# Patient Record
Sex: Male | Born: 1970
Health system: Southern US, Community
[De-identification: ages and names within clinical notes are randomized; demographics above are authoritative.]

## PROBLEM LIST (undated history)

## (undated) ENCOUNTER — Emergency Department: Payer: 59

## (undated) DIAGNOSIS — E291 Testicular hypofunction: Secondary | ICD-10-CM

## (undated) DIAGNOSIS — J302 Other seasonal allergic rhinitis: Secondary | ICD-10-CM

## (undated) DIAGNOSIS — E663 Overweight: Secondary | ICD-10-CM

## (undated) DIAGNOSIS — K219 Gastro-esophageal reflux disease without esophagitis: Secondary | ICD-10-CM

## (undated) DIAGNOSIS — E786 Lipoprotein deficiency: Secondary | ICD-10-CM

## (undated) DIAGNOSIS — N529 Male erectile dysfunction, unspecified: Secondary | ICD-10-CM

## (undated) DIAGNOSIS — J45909 Unspecified asthma, uncomplicated: Secondary | ICD-10-CM

## (undated) HISTORY — DX: Other seasonal allergic rhinitis: J30.2

## (undated) HISTORY — DX: Testicular hypofunction: E29.1

## (undated) HISTORY — DX: Male erectile dysfunction, unspecified: N52.9

## (undated) HISTORY — DX: Gastro-esophageal reflux disease without esophagitis: K21.9

## (undated) HISTORY — DX: Unspecified asthma, uncomplicated: J45.909

## (undated) HISTORY — DX: Overweight: E66.3

## (undated) HISTORY — DX: Lipoprotein deficiency: E78.6

---

## 2016-08-28 ENCOUNTER — Other Ambulatory Visit: Payer: Self-pay | Admitting: Family Medicine

## 2016-08-28 ENCOUNTER — Ambulatory Visit
Admission: RE | Admit: 2016-08-28 | Discharge: 2016-08-28 | Disposition: A | Discharge status: Home / Self Care | Payer: BLUE CROSS/BLUE SHIELD | Source: Ambulatory Visit | Attending: Family Medicine | Admitting: Family Medicine

## 2016-08-28 DIAGNOSIS — R52 Pain, unspecified: Secondary | ICD-10-CM

## 2016-08-28 DIAGNOSIS — M25579 Pain in unspecified ankle and joints of unspecified foot: Secondary | ICD-10-CM | POA: Diagnosis present

## 2016-08-28 DIAGNOSIS — M25571 Pain in right ankle and joints of right foot: Secondary | ICD-10-CM | POA: Diagnosis not present

## 2016-08-28 DIAGNOSIS — M79673 Pain in unspecified foot: Secondary | ICD-10-CM | POA: Diagnosis present

## 2016-08-28 DIAGNOSIS — M79671 Pain in right foot: Secondary | ICD-10-CM | POA: Insufficient documentation

## 2018-01-12 ENCOUNTER — Ambulatory Visit
Admission: RE | Admit: 2018-01-12 | Discharge: 2018-01-12 | Disposition: A | Discharge status: Home / Self Care | Payer: BLUE CROSS/BLUE SHIELD | Source: Ambulatory Visit | Attending: Emergency Medicine | Admitting: Emergency Medicine

## 2018-01-12 ENCOUNTER — Other Ambulatory Visit: Payer: Self-pay | Admitting: Emergency Medicine

## 2018-01-12 DIAGNOSIS — R05 Cough: Secondary | ICD-10-CM | POA: Diagnosis present

## 2018-01-12 DIAGNOSIS — R059 Cough, unspecified: Secondary | ICD-10-CM

## 2018-01-12 DIAGNOSIS — R0602 Shortness of breath: Secondary | ICD-10-CM | POA: Diagnosis present

## 2018-06-29 ENCOUNTER — Other Ambulatory Visit: Payer: Self-pay | Admitting: Family Medicine

## 2018-08-26 ENCOUNTER — Other Ambulatory Visit: Payer: Self-pay

## 2018-08-27 LAB — CBC WITH DIFFERENTIAL/PLATELET
BASOS ABS: 0 10*3/uL (ref 0.0–0.2)
Basos: 1 %
EOS (ABSOLUTE): 0.2 10*3/uL (ref 0.0–0.4)
Eos: 4 %
Hematocrit: 51.8 % — ABNORMAL HIGH (ref 37.5–51.0)
Hemoglobin: 15.6 g/dL (ref 13.0–17.7)
IMMATURE GRANS (ABS): 0 10*3/uL (ref 0.0–0.1)
IMMATURE GRANULOCYTES: 0 %
LYMPHS: 29 %
Lymphocytes Absolute: 1.7 10*3/uL (ref 0.7–3.1)
MCH: 24.5 pg — ABNORMAL LOW (ref 26.6–33.0)
MCHC: 30.1 g/dL — ABNORMAL LOW (ref 31.5–35.7)
MCV: 81 fL (ref 79–97)
Monocytes Absolute: 0.5 10*3/uL (ref 0.1–0.9)
Monocytes: 8 %
NEUTROS PCT: 58 %
Neutrophils Absolute: 3.5 10*3/uL (ref 1.4–7.0)
PLATELETS: 340 10*3/uL (ref 150–450)
RBC: 6.36 x10E6/uL — ABNORMAL HIGH (ref 4.14–5.80)
RDW: 16.8 % — AB (ref 11.6–15.4)
WBC: 5.9 10*3/uL (ref 3.4–10.8)

## 2018-08-27 LAB — TESTOSTERONE,FREE AND TOTAL
Testosterone, Free: 30.1 pg/mL — ABNORMAL HIGH (ref 6.8–21.5)
Testosterone: 724 ng/dL (ref 264–916)

## 2018-10-04 ENCOUNTER — Other Ambulatory Visit: Payer: Self-pay

## 2018-10-05 LAB — CBC WITH DIFFERENTIAL/PLATELET
Basophils Absolute: 0 10*3/uL (ref 0.0–0.2)
Basos: 1 %
EOS (ABSOLUTE): 0.2 10*3/uL (ref 0.0–0.4)
Eos: 3 %
Hematocrit: 48.8 % (ref 37.5–51.0)
Hemoglobin: 15.8 g/dL (ref 13.0–17.7)
Immature Grans (Abs): 0 10*3/uL (ref 0.0–0.1)
Immature Granulocytes: 0 %
Lymphocytes Absolute: 1.4 10*3/uL (ref 0.7–3.1)
Lymphs: 22 %
MCH: 25.4 pg — ABNORMAL LOW (ref 26.6–33.0)
MCHC: 32.4 g/dL (ref 31.5–35.7)
MCV: 79 fL (ref 79–97)
Monocytes Absolute: 0.5 10*3/uL (ref 0.1–0.9)
Monocytes: 7 %
Neutrophils Absolute: 4.3 10*3/uL (ref 1.4–7.0)
Neutrophils: 67 %
Platelets: 313 10*3/uL (ref 150–450)
RBC: 6.22 x10E6/uL — ABNORMAL HIGH (ref 4.14–5.80)
RDW: 17 % — ABNORMAL HIGH (ref 11.6–15.4)
WBC: 6.4 10*3/uL (ref 3.4–10.8)

## 2018-11-16 ENCOUNTER — Emergency Department
Admission: EM | Admit: 2018-11-16 | Discharge: 2018-11-16 | Discharge status: Against Medical Advice | Payer: No Typology Code available for payment source | Attending: Emergency Medicine | Admitting: Emergency Medicine

## 2018-11-16 ENCOUNTER — Encounter: Payer: Self-pay | Admitting: Emergency Medicine

## 2018-11-16 ENCOUNTER — Other Ambulatory Visit: Payer: Self-pay

## 2018-11-16 ENCOUNTER — Emergency Department: Payer: No Typology Code available for payment source

## 2018-11-16 DIAGNOSIS — R0789 Other chest pain: Secondary | ICD-10-CM | POA: Insufficient documentation

## 2018-11-16 DIAGNOSIS — Z77098 Contact with and (suspected) exposure to other hazardous, chiefly nonmedicinal, chemicals: Secondary | ICD-10-CM

## 2018-11-16 DIAGNOSIS — J9801 Acute bronchospasm: Secondary | ICD-10-CM | POA: Insufficient documentation

## 2018-11-16 DIAGNOSIS — Z5320 Procedure and treatment not carried out because of patient's decision for unspecified reasons: Secondary | ICD-10-CM | POA: Diagnosis not present

## 2018-11-16 DIAGNOSIS — J45909 Unspecified asthma, uncomplicated: Secondary | ICD-10-CM | POA: Diagnosis not present

## 2018-11-16 DIAGNOSIS — R0602 Shortness of breath: Secondary | ICD-10-CM | POA: Diagnosis present

## 2018-11-16 MED ORDER — ALBUTEROL SULFATE (2.5 MG/3ML) 0.083% IN NEBU
2.5000 mg | INHALATION_SOLUTION | Freq: Once | RESPIRATORY_TRACT | Status: AC
  Administered 2018-11-16: 2.5 mg via RESPIRATORY_TRACT

## 2018-11-16 MED ORDER — DEXAMETHASONE SODIUM PHOSPHATE 10 MG/ML IJ SOLN
10.0000 mg | Freq: Once | INTRAMUSCULAR | Status: AC
  Administered 2018-11-16: 10 mg via INTRAMUSCULAR
  Filled 2018-11-16: qty 1

## 2018-11-16 MED ORDER — ALBUTEROL SULFATE (2.5 MG/3ML) 0.083% IN NEBU
INHALATION_SOLUTION | RESPIRATORY_TRACT | Status: AC
  Administered 2018-11-16: 15:00:00 2.5 mg via RESPIRATORY_TRACT
  Filled 2018-11-16: qty 3

## 2018-11-16 MED ORDER — IPRATROPIUM-ALBUTEROL 0.5-2.5 (3) MG/3ML IN SOLN
3.0000 mL | Freq: Once | RESPIRATORY_TRACT | Status: AC
  Administered 2018-11-16: 3 mL via RESPIRATORY_TRACT
  Filled 2018-11-16: qty 3

## 2018-11-16 MED ORDER — IPRATROPIUM-ALBUTEROL 0.5-2.5 (3) MG/3ML IN SOLN
3.0000 mL | Freq: Once | RESPIRATORY_TRACT | Status: AC
  Administered 2018-11-16: 14:00:00 3 mL via RESPIRATORY_TRACT
  Filled 2018-11-16: qty 3

## 2018-11-16 MED ORDER — ALBUTEROL SULFATE HFA 108 (90 BASE) MCG/ACT IN AERS: 2.0000 | INHALATION_SPRAY | Freq: Four times a day (QID) | RESPIRATORY_TRACT | 2 refills | Status: AC | PRN

## 2018-11-16 NOTE — ED Provider Notes (Addendum)
[facility] Emergency Department Provider Note   ____________________________________________    I have reviewed the triage vital signs and the nursing notes.   HISTORY  Chief Complaint Shortness of Breath and Chemical Exposure     HPI Seth Ibarra is a 48 y.o. male who presents with complaints of shortness of breath after chemical exposure.  Patient reports that he was exposed to a small amount of chlorine fumes which made him rapidly feel short of breath.  He does have a history of asthma and feels tightness in his chest.  Denies fevers or chills or myalgias.  No nausea or vomiting.  No recent travel.  No calf pain or swelling.  Not take anything for this  History reviewed. No pertinent past medical history.  There are no active problems to display for this patient.   History reviewed. No pertinent surgical history.  Prior to Admission medications   Not on File     Allergies Patient has no known allergies.  No family history on file.  Social History Social History   Tobacco Use  . Smoking status: Never Smoker  . Smokeless tobacco: Never Used  Substance Use Topics  . Alcohol use: Not Currently  . Drug use: Not on file    Review of Systems  Constitutional: No fever/chills Eyes: No visual changes.  ENT: No sore throat. Cardiovascular: Denies chest pain. Respiratory: As above Gastrointestinal: No abdominal pain.  No nausea, no vomiting.   Genitourinary: Negative for dysuria. Musculoskeletal: Negative for back pain. Skin: Negative for rash. Neurological: Negative for headaches or weakness   ____________________________________________   PHYSICAL EXAM:  VITAL SIGNS: ED Triage Vitals  Enc Vitals Group     BP 11/16/18 1148 131/64     Pulse Rate 11/16/18 1148 (!) 104     Resp 11/16/18 1148 16     Temp 11/16/18 1148 98 F (36.7 C)     Temp Source 11/16/18 1148 Oral     SpO2 11/16/18 1148 93 %     Weight 11/16/18 1148 95.3  kg (210 lb)     Height 11/16/18 1148 1.727 m (5\' 8" )     Head Circumference --      Peak Flow --      Pain Score 11/16/18 1146 0     Pain Loc --      Pain Edu? --      Excl. in GC? --     Constitutional: Alert and oriented.  Eyes: Conjunctivae are normal.   Nose: No congestion/rhinnorhea.  Cardiovascular: Mild tachycardia, regular rhythm. Grossly normal heart sounds.  Good peripheral circulation. Respiratory: Normal respiratory effort.  No retractions.  Scattered wheezes Gastrointestinal: Soft and nontender. No distention.  No CVA tenderness.  Musculoskeletal: No lower extremity tenderness nor edema.  Warm and well perfused Neurologic:  Normal speech and language. No gross focal neurologic deficits are appreciated.  Skin:  Skin is warm, dry and intact. No rash noted. Psychiatric: Mood and affect are normal. Speech and behavior are normal.  ____________________________________________   LABS (all labs ordered are listed, but only abnormal results are displayed)  Labs Reviewed  SARS CORONAVIRUS 2 (HOSPITAL ORDER, PERFORMED IN [facility])  CBC WITH DIFFERENTIAL/PLATELET  COMPREHENSIVE METABOLIC PANEL   ____________________________________________  EKG   ____________________________________________  RADIOLOGY  Chest x-ray ____________________________________________   PROCEDURES  Procedure(s) performed: No  Procedures   Critical Care performed: yes  CRITICAL CARE Performed by: Jene Everyobert Moss Berry   Total critical care time: 30  minutes  Critical care time was exclusive of separately billable procedures and treating other patients.  Critical care was necessary to treat or prevent imminent or life-threatening deterioration.  Critical care was time spent personally by me on the following activities: development of treatment plan with patient and/or surrogate as well as nursing, discussions with consultants, evaluation of patient's response to  treatment, examination of patient, obtaining history from patient or surrogate, ordering and performing treatments and interventions, ordering and review of laboratory studies, ordering and review of radiographic studies, pulse oximetry and re-evaluation of patient's condition.  ____________________________________________   INITIAL IMPRESSION / ASSESSMENT AND PLAN / ED COURSE  Pertinent labs & imaging results that were available during my care of the patient were reviewed by me and considered in my medical decision making (see chart for details).  Patient presents with shortness of breath after exposure to chlorine gas, likely bronchospasm related.  We will give IM Decadron, duo nebs, obtain chest x-ray and monitor carefully.  Clinical Course as of Nov 16 1454  Tue Nov 16, 2018  1401 Requiring oxygen, continued scattered wheezing will give additional DuoNeb and send labs, check COVID in case the patient requires admission   [RK]    Clinical Course User Index [RK] Jene Every, MD     ----------------------------------------- 2:55 PM on 11/16/2018 -----------------------------------------  Patient has refused labs, COVID swab, he feels that he will be able to go home, is feeling somewhat better after her DuoNeb however remains mildly tachycardic with oxygen saturations around 91 92%.  He would like to try 1 more nebulizer   ----------------------------------------- 3:13 PM on 11/16/2018 -----------------------------------------  Patient remains tachycardic oxygen saturations are in the low 90s, I recommended that he stay in the emergency department for further observation and consideration of admission but the patient is refusing to stay any longer.  He is leaving AGAINST MEDICAL ADVICE ____________________________________________   FINAL CLINICAL IMPRESSION(S) / ED DIAGNOSES  Final diagnoses:  Exposure to chemical inhalation  Acute bronchospasm        Note:  This  document was prepared using Dragon voice recognition software and may include unintentional dictation errors.   Jene Every, MD 11/16/18 1513    Jene Every, MD 11/24/18 [place]

## 2018-11-16 NOTE — ED Notes (Signed)
Pt continues to report feeling comfortable on RA.

## 2018-11-16 NOTE — ED Notes (Signed)
Pt placed on 2L oxygen via [place].  

## 2018-11-16 NOTE — ED Triage Notes (Signed)
Pt states he inhaled chlorine on the job today, was not wearing respirator mask at that time, states having a hard time getting a full breath, hx of asthma. Denies burning in nose, mouth, states he didn't inhale much. Workers comp case. Pt in no distress at this time.

## 2018-11-16 NOTE — ED Notes (Signed)
Pt requesting to hold off on IV, blood and COVID testing until after Duo-neb. PT does not want further testing unless he will be admitted.

## 2018-12-01 ENCOUNTER — Other Ambulatory Visit: Payer: Self-pay | Admitting: Internal Medicine

## 2018-12-06 ENCOUNTER — Ambulatory Visit: Payer: Self-pay

## 2018-12-06 ENCOUNTER — Other Ambulatory Visit: Payer: Self-pay

## 2018-12-06 DIAGNOSIS — R7989 Other specified abnormal findings of blood chemistry: Secondary | ICD-10-CM

## 2018-12-06 MED ORDER — TESTOSTERONE CYPIONATE 200 MG/ML IM SOLN
100.0000 mg | INTRAMUSCULAR | Status: DC
  Administered 2018-12-06 – 2018-12-20 (×3): 100 mg via INTRAMUSCULAR

## 2018-12-13 ENCOUNTER — Ambulatory Visit: Payer: Self-pay

## 2018-12-13 ENCOUNTER — Other Ambulatory Visit: Payer: Self-pay

## 2018-12-13 DIAGNOSIS — E291 Testicular hypofunction: Secondary | ICD-10-CM

## 2018-12-20 ENCOUNTER — Other Ambulatory Visit: Payer: Self-pay

## 2018-12-20 ENCOUNTER — Ambulatory Visit: Payer: Self-pay

## 2018-12-20 DIAGNOSIS — E291 Testicular hypofunction: Secondary | ICD-10-CM

## 2018-12-27 ENCOUNTER — Other Ambulatory Visit: Payer: Self-pay

## 2018-12-27 ENCOUNTER — Ambulatory Visit: Payer: Self-pay

## 2018-12-27 DIAGNOSIS — E291 Testicular hypofunction: Secondary | ICD-10-CM

## 2018-12-27 MED ORDER — TESTOSTERONE CYPIONATE 200 MG/ML IM SOLN
100.0000 mg | INTRAMUSCULAR | Status: DC
  Administered 2018-12-27 – 2019-02-07 (×4): 100 mg via INTRAMUSCULAR

## 2019-01-03 ENCOUNTER — Other Ambulatory Visit: Payer: Self-pay

## 2019-01-03 ENCOUNTER — Ambulatory Visit: Payer: Self-pay

## 2019-01-03 DIAGNOSIS — E291 Testicular hypofunction: Secondary | ICD-10-CM

## 2019-01-06 ENCOUNTER — Other Ambulatory Visit: Payer: Self-pay

## 2019-01-06 ENCOUNTER — Other Ambulatory Visit: Payer: Managed Care, Other (non HMO)

## 2019-01-06 DIAGNOSIS — E291 Testicular hypofunction: Secondary | ICD-10-CM

## 2019-01-08 LAB — CBC WITH DIFFERENTIAL/PLATELET
Basophils Absolute: 0 10*3/uL (ref 0.0–0.2)
Basos: 0 %
EOS (ABSOLUTE): 0.2 10*3/uL (ref 0.0–0.4)
Eos: 3 %
Hematocrit: 50.9 % (ref 37.5–51.0)
Hemoglobin: 16.4 g/dL (ref 13.0–17.7)
Immature Grans (Abs): 0 10*3/uL (ref 0.0–0.1)
Immature Granulocytes: 0 %
Lymphocytes Absolute: 1.7 10*3/uL (ref 0.7–3.1)
Lymphs: 27 %
MCH: 26.5 pg — ABNORMAL LOW (ref 26.6–33.0)
MCHC: 32.2 g/dL (ref 31.5–35.7)
MCV: 82 fL (ref 79–97)
Monocytes Absolute: 0.5 10*3/uL (ref 0.1–0.9)
Monocytes: 8 %
Neutrophils Absolute: 3.9 10*3/uL (ref 1.4–7.0)
Neutrophils: 62 %
Platelets: 312 10*3/uL (ref 150–450)
RBC: 6.2 x10E6/uL — ABNORMAL HIGH (ref 4.14–5.80)
RDW: 15.3 % (ref 11.6–15.4)
WBC: 6.3 10*3/uL (ref 3.4–10.8)

## 2019-01-08 LAB — TESTOSTERONE,FREE AND TOTAL
Testosterone, Free: 23.1 pg/mL — ABNORMAL HIGH (ref 6.8–21.5)
Testosterone: 457 ng/dL (ref 264–916)

## 2019-01-10 ENCOUNTER — Telehealth: Payer: Self-pay

## 2019-01-10 NOTE — Telephone Encounter (Signed)
Pt aware and he will donate blood he has in the past to get levels lower. He will come every other week not for injections not weekly. Will order labs when he comes for his appt.

## 2019-01-10 NOTE — Telephone Encounter (Signed)
-----   Message from Towanda Malkin, MD sent at 01/10/2019  3:01 PM EDT ----- Please call and let him know that he needs to decrease his testosterone to every other week, same dose (0.5cc). Not on MyChart His levels are still above the normal range for free testosterone (23.1), and his Hgb and Hct are climbing again. Rec's for testosterone injections are to monitor Hct every 3 months during therapy and if elevation occurs, withhold until acceptable level and permanently discontinue if elevated after restarting treatment.   If he desires to donate blood (hasn't in 2020 per the chart), that would be encouraged (with HCT right at that upper limit of normal range).  Please check CBC again in three months, and also testosterone and free testosterone.

## 2019-01-18 ENCOUNTER — Other Ambulatory Visit: Payer: Self-pay

## 2019-01-18 ENCOUNTER — Ambulatory Visit: Payer: Self-pay

## 2019-01-18 DIAGNOSIS — E291 Testicular hypofunction: Secondary | ICD-10-CM

## 2019-02-07 ENCOUNTER — Ambulatory Visit: Payer: Managed Care, Other (non HMO)

## 2019-02-07 ENCOUNTER — Other Ambulatory Visit: Payer: Self-pay

## 2019-02-07 DIAGNOSIS — E291 Testicular hypofunction: Secondary | ICD-10-CM

## 2019-02-07 MED ORDER — TESTOSTERONE CYPIONATE 200 MG/ML IM SOLN
100.0000 mg | INTRAMUSCULAR | Status: AC
  Administered 2019-03-01 – 2019-05-09 (×6): 100 mg via INTRAMUSCULAR

## 2019-03-01 ENCOUNTER — Other Ambulatory Visit: Payer: Self-pay

## 2019-03-01 ENCOUNTER — Ambulatory Visit: Payer: Self-pay

## 2019-03-01 DIAGNOSIS — E291 Testicular hypofunction: Secondary | ICD-10-CM

## 2019-03-14 ENCOUNTER — Other Ambulatory Visit: Payer: Self-pay | Admitting: Internal Medicine

## 2019-03-14 ENCOUNTER — Ambulatory Visit: Payer: Managed Care, Other (non HMO)

## 2019-03-14 ENCOUNTER — Other Ambulatory Visit: Payer: Self-pay

## 2019-03-14 DIAGNOSIS — E291 Testicular hypofunction: Secondary | ICD-10-CM

## 2019-03-15 ENCOUNTER — Other Ambulatory Visit: Payer: Self-pay | Admitting: Internal Medicine

## 2019-03-28 ENCOUNTER — Other Ambulatory Visit: Payer: Self-pay

## 2019-03-28 ENCOUNTER — Ambulatory Visit: Payer: Managed Care, Other (non HMO)

## 2019-03-28 DIAGNOSIS — E291 Testicular hypofunction: Secondary | ICD-10-CM

## 2019-04-11 ENCOUNTER — Ambulatory Visit: Payer: Managed Care, Other (non HMO)

## 2019-04-11 ENCOUNTER — Other Ambulatory Visit: Payer: Self-pay

## 2019-04-11 DIAGNOSIS — E291 Testicular hypofunction: Secondary | ICD-10-CM

## 2019-04-18 ENCOUNTER — Other Ambulatory Visit: Payer: Self-pay

## 2019-04-18 ENCOUNTER — Other Ambulatory Visit: Payer: Managed Care, Other (non HMO)

## 2019-04-18 DIAGNOSIS — Z0000 Encounter for general adult medical examination without abnormal findings: Secondary | ICD-10-CM

## 2019-04-18 LAB — POCT URINALYSIS DIPSTICK
Bilirubin, UA: NEGATIVE
Blood, UA: NEGATIVE
Glucose, UA: NEGATIVE
Ketones, UA: NEGATIVE
Leukocytes, UA: NEGATIVE
Nitrite, UA: NEGATIVE
Protein, UA: POSITIVE — AB
Spec Grav, UA: 1.02 (ref 1.010–1.025)
Urobilinogen, UA: 1 E.U./dL
pH, UA: 6 (ref 5.0–8.0)

## 2019-04-20 LAB — CMP12+LP+TP+TSH+6AC+PSA+CBC…
ALT: 34 IU/L (ref 0–44)
AST: 31 IU/L (ref 0–40)
Albumin/Globulin Ratio: 1.6 (ref 1.2–2.2)
Albumin: 4.3 g/dL (ref 4.0–5.0)
Alkaline Phosphatase: 70 IU/L (ref 39–117)
BUN/Creatinine Ratio: 11 (ref 9–20)
BUN: 11 mg/dL (ref 6–24)
Basophils Absolute: 0 10*3/uL (ref 0.0–0.2)
Basos: 0 %
Bilirubin Total: 0.5 mg/dL (ref 0.0–1.2)
Calcium: 9 mg/dL (ref 8.7–10.2)
Chloride: 101 mmol/L (ref 96–106)
Chol/HDL Ratio: 5.3 ratio — ABNORMAL HIGH (ref 0.0–5.0)
Cholesterol, Total: 147 mg/dL (ref 100–199)
Creatinine, Ser: 1.02 mg/dL (ref 0.76–1.27)
EOS (ABSOLUTE): 0.1 10*3/uL (ref 0.0–0.4)
Eos: 3 %
Estimated CHD Risk: 1.1 times avg. — ABNORMAL HIGH (ref 0.0–1.0)
Free Thyroxine Index: 1.7 (ref 1.2–4.9)
GFR calc Af Amer: 101 mL/min/{1.73_m2} (ref >59)
GFR calc non Af Amer: 88 mL/min/{1.73_m2} (ref >59)
GGT: 11 IU/L (ref 0–65)
Globulin, Total: 2.7 g/dL (ref 1.5–4.5)
Glucose: 86 mg/dL (ref 65–99)
HDL: 28 mg/dL — ABNORMAL LOW (ref >39)
Hematocrit: 51.6 % — ABNORMAL HIGH (ref 37.5–51.0)
Hemoglobin: 16.8 g/dL (ref 13.0–17.7)
Immature Grans (Abs): 0 10*3/uL (ref 0.0–0.1)
Immature Granulocytes: 1 %
Iron: 53 ug/dL (ref 38–169)
LDH: 202 IU/L (ref 121–224)
LDL Chol Calc (NIH): 94 mg/dL (ref 0–99)
Lymphocytes Absolute: 1.4 10*3/uL (ref 0.7–3.1)
Lymphs: 39 %
MCH: 27.5 pg (ref 26.6–33.0)
MCHC: 32.6 g/dL (ref 31.5–35.7)
MCV: 84 fL (ref 79–97)
Monocytes Absolute: 0.4 10*3/uL (ref 0.1–0.9)
Monocytes: 11 %
Neutrophils Absolute: 1.7 10*3/uL (ref 1.4–7.0)
Neutrophils: 46 %
Phosphorus: 3 mg/dL (ref 2.8–4.1)
Platelets: 251 10*3/uL (ref 150–450)
Potassium: 4.1 mmol/L (ref 3.5–5.2)
Prostate Specific Ag, Serum: 0.4 ng/mL (ref 0.0–4.0)
RBC: 6.12 x10E6/uL — ABNORMAL HIGH (ref 4.14–5.80)
RDW: 16.9 % — ABNORMAL HIGH (ref 11.6–15.4)
Sodium: 142 mmol/L (ref 134–144)
T3 Uptake Ratio: 29 % (ref 24–39)
T4, Total: 5.8 ug/dL (ref 4.5–12.0)
TSH: 1.02 u[IU]/mL (ref 0.450–4.500)
Total Protein: 7 g/dL (ref 6.0–8.5)
Triglycerides: 138 mg/dL (ref 0–149)
Uric Acid: 3.9 mg/dL (ref 3.7–8.6)
VLDL Cholesterol Cal: 25 mg/dL (ref 5–40)
WBC: 3.7 10*3/uL (ref 3.4–10.8)

## 2019-04-20 LAB — TESTOSTERONE,FREE AND TOTAL
Testosterone, Free: 11 pg/mL (ref 6.8–21.5)
Testosterone: 228 ng/dL — ABNORMAL LOW (ref 264–916)

## 2019-04-25 ENCOUNTER — Ambulatory Visit: Payer: Self-pay

## 2019-04-25 ENCOUNTER — Encounter: Payer: Self-pay | Admitting: Physician Assistant

## 2019-04-25 ENCOUNTER — Other Ambulatory Visit: Payer: Self-pay

## 2019-04-25 ENCOUNTER — Ambulatory Visit: Payer: Managed Care, Other (non HMO)

## 2019-04-25 VITALS — BP 132/90 | HR 94 | Temp 98.6°F | Resp 12 | Ht 67.0 in | Wt 204.0 lb

## 2019-04-25 DIAGNOSIS — Z0000 Encounter for general adult medical examination without abnormal findings: Secondary | ICD-10-CM

## 2019-04-25 NOTE — Progress Notes (Signed)
   Subjective:Annual physical exam    Patient ID: Seth Ibarra, male    DOB: December 27, 1970, 48 y.o.   MRN: [id number]  HPI Patient present for annual physical exam. No compliant or concern.   Review of Systems    Negatve Objective:   Physical Exam HEENT unremarkable Neck supple without bruits or adenopathy Lungs CTA Heart RRR Abdomen with normoactive BS SNTTP No cervical or lumbar deformty No Deformity of upper/lower extremities CNII-XII grossly intact       Assessment & Plan:Well exam  Continue previous medication and follow up as needed.

## 2019-05-09 ENCOUNTER — Other Ambulatory Visit: Payer: Self-pay

## 2019-05-09 ENCOUNTER — Ambulatory Visit: Payer: Self-pay

## 2019-05-09 DIAGNOSIS — E291 Testicular hypofunction: Secondary | ICD-10-CM

## 2019-05-17 ENCOUNTER — Other Ambulatory Visit: Payer: Self-pay

## 2019-05-17 DIAGNOSIS — J45909 Unspecified asthma, uncomplicated: Secondary | ICD-10-CM

## 2019-05-17 MED ORDER — ADVAIR DISKUS 100-50 MCG/DOSE IN AEPB: INHALATION_SPRAY | RESPIRATORY_TRACT | 0 refills | Status: DC

## 2019-05-17 NOTE — Telephone Encounter (Signed)
Patient saw PA Tamala Julian on 04/25/2019.  Patient given 30 day supply advair diskus refill electronic Rx to his pharmacy of choice.  Will review paper chart tomorrow when onsite at clinic to validate previous Rx and appt history from [place] providers. Epic reviewed renal and liver function stable Nov 2020 labs.  Last chest xray 12/2017 WNL pulmonary; degenerative changes noted thoracic spine.

## 2019-05-18 MED ORDER — ADVAIR DISKUS 100-50 MCG/DOSE IN AEPB: INHALATION_SPRAY | RESPIRATORY_TRACT | 5 refills | Status: AC

## 2019-05-18 NOTE — Telephone Encounter (Signed)
Tried to call Seth Ibarra & went straight to voice mail.  Left message for him to call me back.  AMD

## 2019-05-18 NOTE — Telephone Encounter (Signed)
Minor called back.  States when he switched to [place], they gave him [facility] & said it was the generic for Advair.  He said now [facility] is giving him Advair & that's the only one he takes.  He does NOT take both inhalers.  AMD

## 2019-05-18 NOTE — Addendum Note (Signed)
Addended by: Gerarda Fraction A on: 05/18/2019 05:25 PM   Modules accepted: Orders

## 2019-05-18 NOTE — Telephone Encounter (Signed)
Please verify patient not taking Wixela inhaler also.  Pick one advair or Wixela and let us know which he wants filled/I will add refills after clarification.  Per paper chart wixela 100/49mcg 2 puffs BID #60 RF5 Mar 2020 by Dr Roxan Hockey.  2018 physical Dr Cheryll Cockayne stated asthma well controlled with advair. PA Truman Hayward saw patient 2019 physical patient only taking proair Oct but in Nov refill wixela 100/50 2 puffs bid given 27 Apr 2018 by me per patient request.

## 2019-05-18 NOTE — Telephone Encounter (Signed)
Noted refills added to advair for patient #60 RF5.

## 2019-05-23 ENCOUNTER — Ambulatory Visit: Payer: Self-pay

## 2019-06-06 ENCOUNTER — Ambulatory Visit: Payer: Self-pay

## 2019-06-20 ENCOUNTER — Ambulatory Visit: Payer: Self-pay

## 2019-06-28 DIAGNOSIS — S39011A Strain of muscle, fascia and tendon of abdomen, initial encounter: Secondary | ICD-10-CM | POA: Diagnosis not present

## 2019-07-04 ENCOUNTER — Ambulatory Visit: Payer: Self-pay

## 2019-07-25 ENCOUNTER — Other Ambulatory Visit: Payer: Self-pay

## 2019-07-25 ENCOUNTER — Ambulatory Visit: Payer: Self-pay | Admitting: Physician Assistant

## 2019-07-25 VITALS — BP 120/90 | HR 93 | Temp 98.6°F | Ht 68.0 in | Wt 206.0 lb

## 2019-07-25 DIAGNOSIS — K219 Gastro-esophageal reflux disease without esophagitis: Secondary | ICD-10-CM

## 2019-07-25 MED ORDER — ESOMEPRAZOLE MAGNESIUM 40 MG PO CPDR: 40.0000 mg | DELAYED_RELEASE_CAPSULE | Freq: Every day | ORAL | 3 refills | Status: AC

## 2019-07-25 NOTE — Progress Notes (Signed)
   Subjective: Gastric reflux    Patient ID: Seth Ibarra, male    DOB: September 10, 1970, 49 y.o.   MRN: [id number]  HPI Patient presents with approximately 1 week of epigastric pain.  Patient states pain increases with eating.  Patient has history of reflux and normally use Prilosec.  Patient states the medicine does not seem to be effective as it has been in the past.  Review of Systems    Hypogonadism Objective:   Physical Exam Patient appears no acute distress.  HEENT is grossly unremarkable.  Neck is supple for adenopathy or bruits.  Lungs are clear to auscultation heart regular rate and rhythm.       Assessment & Plan: Gastric reflux  Patient was given a trial of Nexium for 2 weeks.  Patient advised follow-up in 1 week to check progress of medication.  Will consider labs and endoscopic exams if no improvement or worsening complaints.

## 2019-08-03 ENCOUNTER — Ambulatory Visit: Payer: Self-pay | Admitting: Registered Nurse

## 2019-08-03 ENCOUNTER — Encounter: Payer: Self-pay | Admitting: Registered Nurse

## 2019-08-03 ENCOUNTER — Other Ambulatory Visit: Payer: Self-pay

## 2019-08-03 VITALS — BP 120/80 | HR 96 | Temp 97.5°F | Resp 16 | Ht 67.0 in | Wt 204.0 lb

## 2019-08-03 DIAGNOSIS — Z6831 Body mass index (BMI) 31.0-31.9, adult: Secondary | ICD-10-CM

## 2019-08-03 DIAGNOSIS — K219 Gastro-esophageal reflux disease without esophagitis: Secondary | ICD-10-CM

## 2019-08-03 NOTE — Progress Notes (Signed)
Subjective:    Patient ID: Seth Ibarra, male    DOB: 24-Feb-1971, 49 y.o.   MRN: [id number]  48y/o caucasian male established patient here for re-evaluation epigastric pain was started on nexium by PA Tamala Julian and patient reported helped a great deal along with TUMS prn.  Patient had course of prednisone in Jan 2020 for muscle strain and had been on omeprazole 10mg  po daily at that time.  Reported stools normal daily denied bright red blood or coffee ground stools.  Denied n/v/d/fever/chills.  Denied history of EGD or CSP.  Last physical with [facility] Nov 2020.  Patient had been on testosterone but stopped/Dr Roxan Hockey weaning down his doses and patient saw no reason to continue at those doses not helping him.  Has not given blood in the past year or had phlebotomy either due to covid and stopping testosterone injections.  Patient reported he works out at [employer] and walks when at work averaging 8-10,000 steps on work days per phone.     Review of Systems  Constitutional: Negative for activity change, appetite change, chills, diaphoresis, fatigue, fever and unexpected weight change.  HENT: Negative for trouble swallowing and voice change.   Eyes: Negative for photophobia and visual disturbance.  Respiratory: Negative for cough, shortness of breath, wheezing and stridor.   Cardiovascular: Negative for chest pain, palpitations and leg swelling.  Gastrointestinal: Positive for abdominal pain. Negative for abdominal distention, anal bleeding, blood in stool, constipation, diarrhea, nausea and vomiting.  Endocrine: Negative for cold intolerance and heat intolerance.  Genitourinary: Negative for difficulty urinating.  Musculoskeletal: Negative for back pain, gait problem, joint swelling, neck pain and neck stiffness.  Skin: Negative for rash.  Allergic/Immunologic: Positive for environmental allergies. Negative for food allergies.  Neurological: Negative for dizziness, tremors, seizures,  syncope, facial asymmetry, speech difficulty, weakness, light-headedness, numbness and headaches.  Hematological: Negative for adenopathy. Does not bruise/bleed easily.  Psychiatric/Behavioral: Negative for agitation, confusion and sleep disturbance.       Objective:   Physical Exam Vitals and nursing note reviewed.  Constitutional:      General: He is awake. He is not in acute distress.    Appearance: Normal appearance. He is well-developed and well-groomed. He is obese. He is not ill-appearing, toxic-appearing or diaphoretic.  HENT:     Head: Normocephalic and atraumatic.     Jaw: There is normal jaw occlusion.     Salivary Glands: Right salivary gland is not diffusely enlarged or tender. Left salivary gland is not diffusely enlarged or tender.     Right Ear: Hearing and external ear normal.     Left Ear: Hearing and external ear normal.     Nose: Nose normal.     Mouth/Throat:     Lips: Pink. No lesions.     Mouth: Mucous membranes are moist.     Pharynx: Oropharynx is clear.  Eyes:     General: Lids are normal. Vision grossly intact. Gaze aligned appropriately. No allergic shiner, visual field deficit or scleral icterus.       Right eye: No discharge.        Left eye: No discharge.     Extraocular Movements: Extraocular movements intact.     Conjunctiva/sclera: Conjunctivae normal.     Pupils: Pupils are equal, round, and reactive to light.  Neck:     Vascular: No carotid bruit.     Trachea: Trachea and phonation normal.  Cardiovascular:     Rate and Rhythm: Normal rate and  regular rhythm.     Pulses: Normal pulses.          Radial pulses are 2+ on the right side and 2+ on the left side.     Heart sounds: Normal heart sounds. No murmur. No friction rub. No gallop.   Pulmonary:     Effort: Pulmonary effort is normal.     Breath sounds: Normal breath sounds and air entry. No stridor, decreased air movement or transmitted upper airway sounds. No decreased breath sounds,  wheezing, rhonchi or rales.     Comments: Wearing cloth mask due to covid 19 pandemic; spoke full sentences without difficulty; no cough observed in exam room Abdominal:     General: Abdomen is flat. Bowel sounds are normal. There is no distension.     Palpations: Abdomen is soft. There is no mass.     Tenderness: There is abdominal tenderness in the right upper quadrant, epigastric area and left upper quadrant. There is no right CVA tenderness, left CVA tenderness, guarding or rebound. Negative signs include Murphy's sign and McBurney's sign.     Hernia: No hernia is present. There is no hernia in the umbilical area or ventral area.     Comments: Mild 2/10 pain with palpation RUQ/epigastric/LUQ; dull to percussion except LUQ tympanny and normoactive bowel sounds; standing to sitting supine and reverse quickly without assistance; no ventral hernia/bulge noted with abdominal crunch  Musculoskeletal:        General: No swelling, tenderness, deformity or signs of injury. Normal range of motion.     Right shoulder: Normal.     Left shoulder: Normal.     Right elbow: Normal.     Left elbow: Normal.     Right hand: Normal.     Left hand: Normal.     Cervical back: Normal, normal range of motion and neck supple. No rigidity or tenderness.     Thoracic back: Normal.     Lumbar back: Normal.     Right hip: Normal.     Left hip: Normal.     Right knee: Normal.     Left knee: Normal.     Right lower leg: No edema.     Left lower leg: No edema.  Lymphadenopathy:     Head:     Right side of head: No submental, submandibular, tonsillar, preauricular, posterior auricular or occipital adenopathy.     Left side of head: No submental, submandibular, tonsillar, preauricular, posterior auricular or occipital adenopathy.     Cervical: No cervical adenopathy.     Right cervical: No superficial, deep or posterior cervical adenopathy.    Left cervical: No superficial, deep or posterior cervical adenopathy.   Skin:    General: Skin is warm and dry.     Capillary Refill: Capillary refill takes less than 2 seconds.     Coloration: Skin is not ashen, cyanotic, jaundiced, mottled, pale or sallow.     Findings: No abrasion, abscess, acne, bruising, burn, ecchymosis, erythema, signs of injury, laceration, lesion, petechiae, rash or wound.     Nails: There is no clubbing.  Neurological:     General: No focal deficit present.     Mental Status: He is alert and oriented to person, place, and time. Mental status is at baseline.     Cranial Nerves: Cranial nerves are intact. No cranial nerve deficit, dysarthria or facial asymmetry.     Sensory: Sensation is intact. No sensory deficit.     Motor: Motor function is intact.  No weakness, tremor, atrophy, abnormal muscle tone or seizure activity.     Coordination: Coordination is intact. Coordination normal.     Gait: Gait is intact. Gait normal.     Comments: In/out of chair and on/off exam table without difficulty; bilateral hand grasp equal 5/5; gait sure and steady in hallway  Psychiatric:        Attention and Perception: Attention and perception normal.        Mood and Affect: Mood and affect normal.        Speech: Speech normal.        Behavior: Behavior normal. Behavior is cooperative.        Thought Content: Thought content normal.        Cognition and Memory: Cognition and memory normal.        Judgment: Judgment normal.           Assessment & Plan:  A-GERD, BMI 31  P-Discussed prednisone probably worsened his baseline gastritis in January 2021.  Discontinued omeprazole rx.  Anti-reflux measures such as raising the head of the bed, avoiding tight clothing or belts, avoiding eating late at night and not lying down shortly after mealtime and achieving weight loss were discussed. Avoid ASA, NSAID's, caffeine, peppermints, alcohol and tobacco. OTC H2 blockers and/or antacids are often very helpful for PRN use.  Patient has 3 refills remaining on  nexium 40mg  po daily #30 RF3 Rx from [facility].  Discussed with patient encouraged weight loss as BMI 31; increase activity to 10000 steps every workday plus one day off; continue gym work also.  However, for chronic or daily symptoms, prescription strength H2 blockers or a trial of PPI's should be used.  Patient should alert me if there are persistent symptoms, dysphagia, weight loss or GI bleeding (bright red or black). Follow up with clinic provider or PCM if symptoms persist despite therapy.  Exitcare handout printed and given on GERD to patient and preventing health risks of overweight Patient verbalized agreement and understanding of treatment plan and had no further questions at this time.     Hx polycythemia not donating blood or phlebotomy in the past year encouraged him to donate blood 1 unit before Nov 2021 physical.  Worried about immune system/exposures to other people during pandemic.  Discussed if he wants to restart testosterone will need urology referral/follow up.  Discussed cardiac risks of testosterone supplementation increases with age.  Discussed his lipid panel with low HDL continue activity/exercise 150 minutes per week and dietary fiber. Patient verbalized understanding information/instructions, agreed with plan of care and had no further questions at this time.   P2:  Diet, Food avoidance that aggravate condition, and Fitness

## 2019-08-03 NOTE — Patient Instructions (Addendum)
Donate blood Increase activity to 10,000 steps 5-6 days per week Weight loss  Gastroesophageal Reflux Disease, Adult Gastroesophageal reflux (GER) happens when acid from the stomach flows up into the tube that connects the mouth and the stomach (esophagus). Normally, food travels down the esophagus and stays in the stomach to be digested. However, when a person has GER, food and stomach acid sometimes move back up into the esophagus. If this becomes a more serious problem, the person may be diagnosed with a disease called gastroesophageal reflux disease (GERD). GERD occurs when the reflux:  Happens often.  Causes frequent or severe symptoms.  Causes problems such as damage to the esophagus. When stomach acid comes in contact with the esophagus, the acid may cause soreness (inflammation) in the esophagus. Over time, GERD may create small holes (ulcers) in the lining of the esophagus. What are the causes? This condition is caused by a problem with the muscle between the esophagus and the stomach (lower esophageal sphincter, or LES). Normally, the LES muscle closes after food passes through the esophagus to the stomach. When the LES is weakened or abnormal, it does not close properly, and that allows food and stomach acid to go back up into the esophagus. The LES can be weakened by certain dietary substances, medicines, and medical conditions, including:  Tobacco use.  Pregnancy.  Having a hiatal hernia.  Alcohol use.  Certain foods and beverages, such as coffee, chocolate, onions, and peppermint. What increases the risk? You are more likely to develop this condition if you:  Have an increased body weight.  Have a connective tissue disorder.  Use NSAID medicines. What are the signs or symptoms? Symptoms of this condition include:  Heartburn.  Difficult or painful swallowing.  The feeling of having a lump in the throat.  Abitter taste in the mouth.  Bad breath.  Having a  large amount of saliva.  Having an upset or bloated stomach.  Belching.  Chest pain. Different conditions can cause chest pain. Make sure you see your health care provider if you experience chest pain.  Shortness of breath or wheezing.  Ongoing (chronic) cough or a night-time cough.  Wearing away of tooth enamel.  Weight loss. How is this diagnosed? Your health care provider will take a medical history and perform a physical exam. To determine if you have mild or severe GERD, your health care provider may also monitor how you respond to treatment. You may also have tests, including:  A test to examine your stomach and esophagus with a small camera (endoscopy).  A test thatmeasures the acidity level in your esophagus.  A test thatmeasures how much pressure is on your esophagus.  A barium swallow or modified barium swallow test to show the shape, size, and functioning of your esophagus. How is this treated? The goal of treatment is to help relieve your symptoms and to prevent complications. Treatment for this condition may vary depending on how severe your symptoms are. Your health care provider may recommend:  Changes to your diet.  Medicine.  Surgery. Follow these instructions at home: Eating and drinking   Follow a diet as recommended by your health care provider. This may involve avoiding foods and drinks such as: ? Coffee and tea (with or without caffeine). ? Drinks that containalcohol. ? Energy drinks and sports drinks. ? Carbonated drinks or sodas. ? Chocolate and cocoa. ? Peppermint and mint flavorings. ? Garlic and onions. ? Horseradish. ? Spicy and acidic foods, including peppers, chili powder,  curry powder, vinegar, hot sauces, and barbecue sauce. ? Citrus fruit juices and citrus fruits, such as oranges, lemons, and limes. ? Tomato-based foods, such as red sauce, chili, salsa, and pizza with red sauce. ? Fried and fatty foods, such as donuts, french fries,  potato chips, and high-fat dressings. ? High-fat meats, such as hot dogs and fatty cuts of red and white meats, such as rib eye steak, sausage, ham, and bacon. ? High-fat dairy items, such as whole milk, butter, and cream cheese.  Eat small, frequent meals instead of large meals.  Avoid drinking large amounts of liquid with your meals.  Avoid eating meals during the 2-3 hours before bedtime.  Avoid lying down right after you eat.  Do not exercise right after you eat. Lifestyle   Do not use any products that contain nicotine or tobacco, such as cigarettes, e-cigarettes, and chewing tobacco. If you need help quitting, ask your health care provider.  Try to reduce your stress by using methods such as yoga or meditation. If you need help reducing stress, ask your health care provider.  If you are overweight, reduce your weight to an amount that is healthy for you. Ask your health care provider for guidance about a safe weight loss goal. General instructions  Pay attention to any changes in your symptoms.  Take over-the-counter and prescription medicines only as told by your health care provider. Do not take aspirin, ibuprofen, or other NSAIDs unless your health care provider told you to do so.  Wear loose-fitting clothing. Do not wear anything tight around your waist that causes pressure on your abdomen.  Raise (elevate) the head of your bed about 6 inches (15 cm).  Avoid bending over if this makes your symptoms worse.  Keep all follow-up visits as told by your health care provider. This is important. Contact a health care provider if:  You have: ? New symptoms. ? Unexplained weight loss. ? Difficulty swallowing or it hurts to swallow. ? Wheezing or a persistent cough. ? A hoarse voice.  Your symptoms do not improve with treatment. Get help right away if you:  Have pain in your arms, neck, jaw, teeth, or back.  Feel sweaty, dizzy, or light-headed.  Have chest pain or  shortness of breath.  Vomit and your vomit looks like blood or coffee grounds.  Faint.  Have stool that is bloody or black.  Cannot swallow, drink, or eat. Summary  Gastroesophageal reflux happens when acid from the stomach flows up into the esophagus. GERD is a disease in which the reflux happens often, causes frequent or severe symptoms, or causes problems such as damage to the esophagus.  Treatment for this condition may vary depending on how severe your symptoms are. Your health care provider may recommend diet and lifestyle changes, medicine, or surgery.  Contact a health care provider if you have new or worsening symptoms.  Take over-the-counter and prescription medicines only as told by your health care provider. Do not take aspirin, ibuprofen, or other NSAIDs unless your health care provider told you to do so.  Keep all follow-up visits as told by your health care provider. This is important. This information is not intended to replace advice given to you by your health care provider. Make sure you discuss any questions you have with your health care provider. Document Revised: 12/09/2017 Document Reviewed: 12/09/2017 Elsevier Patient Education  2020 Elsevier [employer]. Preventing Health Risks of Being Overweight Maintaining a healthy body weight is an important part of  your overall health. Your healthy body weight depends on your age, gender, and height. Being overweight puts you at risk for many health problems, including:  Heart disease.  Diabetes.  Problems sleeping.  Joint problems. You can make changes to your diet and lifestyle to prevent these risks. Consider working with a health care provider or a dietitian to make these changes. What nutrition changes can be made?   Eat only as much as your body needs. In most cases, this is about 2,000 calories a day, but the amount varies depending on your height, gender, and activity level. Ask your health care provider how many  calories you should have each day. Eating more than your body needs on a regular basis can cause you to become overweight or obese.  Eat slowly, and stop eating when you feel full.  Choose healthy foods, including: ? Fruits and vegetables. ? Lean meats. ? Low-fat dairy products. ? High-fiber foods, such as whole grains and beans. ? Healthy snacks like vegetable sticks, a piece of fruit, or a small amount of yogurt or cheese.  Avoid foods and drinks that are high in sugar, salt (sodium), saturated fat, or trans fat. This includes: ? Many desserts such as candy, cookies, and ice cream. ? Soda. ? Fried foods. ? Processed meats such as hot dogs or lunch meats. ? Prepackaged snack foods. What lifestyle changes can be made?   Exercise for at least 150 minutes a week to prevent weight gain, or as often as recommended by your health care provider. Do moderate-intensity exercise, such as brisk walking. ? Spread it out by exercising for 30 minutes 5 days a week, or in short 10-minute bursts several times a day.  Find other ways to stay active and burn calories, such as yard work or a hobby that involves physical activity.  Get at least 8 hours of sleep each night. When you are well-rested, you are more likely to be active and make healthy choices during the day. To sleep better: ? Try to go to bed and wake up at about the same time every day. ? Keep your bedroom dark, quiet, and cool. ? Make sure that your bed is comfortable. ? Avoid stimulating activities, such as watching television or exercising, for at least one hour before bedtime. Why are these changes important? Eating healthy and being active helps you lose weight and prevent health problems caused by being overweight. Making these changes can also help you manage stress, feel better mentally, and connect with friends and family. What can happen if changes are not made? Being overweight can affect you for your entire life. You may  develop joint or bone problems that make it painful or difficult for you to play sports or do activities you enjoy. Being overweight puts stress on your heart and lungs and can lead to medical problems like diabetes, heart disease, and sleeping problems. Where to find support You can get support for preventing health risks of being overweight from:  Your health care provider or a dietitian. They can provide guidance about healthy eating and healthy lifestyle choices.  Weight loss support groups, online or in-person. Where to find more information  MyPlate: https://ball-collins.biz/ ? This an online tool that provides personalized recommendations about foods to eat each day.  The Centers for Disease Control and Prevention: AffordableScrapbook.gl ? This resource gives tips for managing weight and having an active lifestyle. Summary  To prevent unhealthy weight gain, it is important to maintain a healthy diet  high in vegetables and whole grains, exercise regularly, and get at least 8 hours of sleep each night.  Making these changes helps prevent many long-term (chronic) health conditions that can shorten your life, such as diabetes, heart disease, and stroke. This information is not intended to replace advice given to you by your health care provider. Make sure you discuss any questions you have with your health care provider. Document Revised: 02/23/2019 Document Reviewed: 04/29/2017 Elsevier Patient Education  [place].

## 2019-08-03 NOTE — Progress Notes (Signed)
Follow up appt from 07/25/19 with [name], PA-C (Interim Provider).  Seth Ibarra advised Seth Ibarra to stop Prilosec because he had been taking for many years & prescribed Seth Ibarra Nexium 40 mg.  States the pains he was experiencing last week are almost resolved. Thinks he may have some GI symptoms - upset stomach. Overall he says he thinks it's working & he feels better.  AMD

## 2019-09-12 DIAGNOSIS — J4540 Moderate persistent asthma, uncomplicated: Secondary | ICD-10-CM | POA: Insufficient documentation

## 2019-09-12 DIAGNOSIS — R7989 Other specified abnormal findings of blood chemistry: Secondary | ICD-10-CM | POA: Insufficient documentation

## 2019-12-07 ENCOUNTER — Other Ambulatory Visit: Payer: Self-pay | Admitting: Physical Medicine and Rehabilitation

## 2019-12-07 DIAGNOSIS — M5030 Other cervical disc degeneration, unspecified cervical region: Secondary | ICD-10-CM

## 2019-12-07 DIAGNOSIS — M5412 Radiculopathy, cervical region: Secondary | ICD-10-CM

## 2019-12-27 ENCOUNTER — Other Ambulatory Visit: Payer: Self-pay

## 2019-12-27 ENCOUNTER — Other Ambulatory Visit: Payer: 59

## 2019-12-27 ENCOUNTER — Ambulatory Visit
Admission: RE | Admit: 2019-12-27 | Discharge: 2019-12-27 | Disposition: A | Discharge status: Home / Self Care | Payer: 59 | Source: Ambulatory Visit | Attending: Physical Medicine and Rehabilitation | Admitting: Physical Medicine and Rehabilitation

## 2019-12-27 DIAGNOSIS — M5412 Radiculopathy, cervical region: Secondary | ICD-10-CM

## 2019-12-27 DIAGNOSIS — M5030 Other cervical disc degeneration, unspecified cervical region: Secondary | ICD-10-CM

## 2020-01-10 ENCOUNTER — Other Ambulatory Visit: Payer: 59

## 2020-01-17 DIAGNOSIS — M5030 Other cervical disc degeneration, unspecified cervical region: Secondary | ICD-10-CM | POA: Diagnosis not present

## 2020-01-20 DIAGNOSIS — M5030 Other cervical disc degeneration, unspecified cervical region: Secondary | ICD-10-CM | POA: Diagnosis not present

## 2020-01-23 DIAGNOSIS — M5030 Other cervical disc degeneration, unspecified cervical region: Secondary | ICD-10-CM | POA: Diagnosis not present

## 2020-02-06 DIAGNOSIS — M5030 Other cervical disc degeneration, unspecified cervical region: Secondary | ICD-10-CM | POA: Diagnosis not present

## 2020-02-10 DIAGNOSIS — M5030 Other cervical disc degeneration, unspecified cervical region: Secondary | ICD-10-CM | POA: Diagnosis not present

## 2020-02-13 DIAGNOSIS — M5030 Other cervical disc degeneration, unspecified cervical region: Secondary | ICD-10-CM | POA: Diagnosis not present

## 2020-02-16 DIAGNOSIS — M5030 Other cervical disc degeneration, unspecified cervical region: Secondary | ICD-10-CM | POA: Diagnosis not present

## 2020-02-21 DIAGNOSIS — M5030 Other cervical disc degeneration, unspecified cervical region: Secondary | ICD-10-CM | POA: Diagnosis not present

## 2020-02-23 DIAGNOSIS — M5030 Other cervical disc degeneration, unspecified cervical region: Secondary | ICD-10-CM | POA: Diagnosis not present

## 2020-02-27 DIAGNOSIS — M5030 Other cervical disc degeneration, unspecified cervical region: Secondary | ICD-10-CM | POA: Diagnosis not present

## 2020-03-01 DIAGNOSIS — M5030 Other cervical disc degeneration, unspecified cervical region: Secondary | ICD-10-CM | POA: Diagnosis not present

## 2020-03-06 DIAGNOSIS — M5030 Other cervical disc degeneration, unspecified cervical region: Secondary | ICD-10-CM | POA: Diagnosis not present

## 2020-03-08 DIAGNOSIS — M5030 Other cervical disc degeneration, unspecified cervical region: Secondary | ICD-10-CM | POA: Diagnosis not present

## 2020-03-13 DIAGNOSIS — M5030 Other cervical disc degeneration, unspecified cervical region: Secondary | ICD-10-CM | POA: Diagnosis not present

## 2020-03-16 DIAGNOSIS — M5030 Other cervical disc degeneration, unspecified cervical region: Secondary | ICD-10-CM | POA: Diagnosis not present

## 2020-03-19 DIAGNOSIS — M5030 Other cervical disc degeneration, unspecified cervical region: Secondary | ICD-10-CM | POA: Diagnosis not present

## 2020-03-22 DIAGNOSIS — M5030 Other cervical disc degeneration, unspecified cervical region: Secondary | ICD-10-CM | POA: Diagnosis not present

## 2020-03-26 DIAGNOSIS — M5030 Other cervical disc degeneration, unspecified cervical region: Secondary | ICD-10-CM | POA: Diagnosis not present

## 2020-03-28 DIAGNOSIS — M5412 Radiculopathy, cervical region: Secondary | ICD-10-CM | POA: Diagnosis not present

## 2020-03-28 DIAGNOSIS — M5030 Other cervical disc degeneration, unspecified cervical region: Secondary | ICD-10-CM | POA: Diagnosis not present

## 2020-04-03 DIAGNOSIS — M5030 Other cervical disc degeneration, unspecified cervical region: Secondary | ICD-10-CM | POA: Diagnosis not present

## 2020-04-03 DIAGNOSIS — M5412 Radiculopathy, cervical region: Secondary | ICD-10-CM | POA: Diagnosis not present

## 2020-04-23 DIAGNOSIS — M5412 Radiculopathy, cervical region: Secondary | ICD-10-CM | POA: Diagnosis not present

## 2020-04-23 DIAGNOSIS — M5030 Other cervical disc degeneration, unspecified cervical region: Secondary | ICD-10-CM | POA: Diagnosis not present

## 2020-05-07 DIAGNOSIS — E538 Deficiency of other specified B group vitamins: Secondary | ICD-10-CM | POA: Diagnosis not present

## 2020-05-07 DIAGNOSIS — R7989 Other specified abnormal findings of blood chemistry: Secondary | ICD-10-CM | POA: Diagnosis not present

## 2020-05-07 DIAGNOSIS — Z0000 Encounter for general adult medical examination without abnormal findings: Secondary | ICD-10-CM | POA: Diagnosis not present

## 2020-05-14 DIAGNOSIS — R7989 Other specified abnormal findings of blood chemistry: Secondary | ICD-10-CM | POA: Diagnosis not present

## 2020-05-14 DIAGNOSIS — Z0000 Encounter for general adult medical examination without abnormal findings: Secondary | ICD-10-CM | POA: Diagnosis not present

## 2020-05-28 DIAGNOSIS — R7989 Other specified abnormal findings of blood chemistry: Secondary | ICD-10-CM | POA: Diagnosis not present

## 2020-05-28 DIAGNOSIS — Z125 Encounter for screening for malignant neoplasm of prostate: Secondary | ICD-10-CM | POA: Diagnosis not present

## 2020-05-28 DIAGNOSIS — M542 Cervicalgia: Secondary | ICD-10-CM | POA: Diagnosis not present

## 2020-06-18 DIAGNOSIS — M5412 Radiculopathy, cervical region: Secondary | ICD-10-CM | POA: Diagnosis not present

## 2020-06-18 DIAGNOSIS — M4802 Spinal stenosis, cervical region: Secondary | ICD-10-CM | POA: Diagnosis not present

## 2020-06-18 DIAGNOSIS — M5030 Other cervical disc degeneration, unspecified cervical region: Secondary | ICD-10-CM | POA: Diagnosis not present

## 2020-07-05 DIAGNOSIS — M5412 Radiculopathy, cervical region: Secondary | ICD-10-CM | POA: Diagnosis not present

## 2020-07-05 DIAGNOSIS — M5030 Other cervical disc degeneration, unspecified cervical region: Secondary | ICD-10-CM | POA: Diagnosis not present

## 2020-07-26 DIAGNOSIS — M5030 Other cervical disc degeneration, unspecified cervical region: Secondary | ICD-10-CM | POA: Diagnosis not present

## 2020-07-26 DIAGNOSIS — M5412 Radiculopathy, cervical region: Secondary | ICD-10-CM | POA: Diagnosis not present

## 2020-07-26 DIAGNOSIS — M4802 Spinal stenosis, cervical region: Secondary | ICD-10-CM | POA: Diagnosis not present

## 2020-08-28 DIAGNOSIS — M5030 Other cervical disc degeneration, unspecified cervical region: Secondary | ICD-10-CM | POA: Diagnosis not present

## 2020-08-28 DIAGNOSIS — G5602 Carpal tunnel syndrome, left upper limb: Secondary | ICD-10-CM | POA: Diagnosis not present

## 2020-08-29 DIAGNOSIS — R0789 Other chest pain: Secondary | ICD-10-CM | POA: Diagnosis not present

## 2020-08-29 DIAGNOSIS — R7989 Other specified abnormal findings of blood chemistry: Secondary | ICD-10-CM | POA: Diagnosis not present

## 2020-09-17 DIAGNOSIS — R7989 Other specified abnormal findings of blood chemistry: Secondary | ICD-10-CM | POA: Diagnosis not present

## 2020-09-17 DIAGNOSIS — R0789 Other chest pain: Secondary | ICD-10-CM | POA: Diagnosis not present

## 2020-09-19 ENCOUNTER — Ambulatory Visit: Payer: 59

## 2020-09-19 ENCOUNTER — Other Ambulatory Visit: Payer: Self-pay | Admitting: Internal Medicine

## 2020-09-19 DIAGNOSIS — R0600 Dyspnea, unspecified: Secondary | ICD-10-CM | POA: Diagnosis not present

## 2020-09-19 DIAGNOSIS — R0781 Pleurodynia: Secondary | ICD-10-CM

## 2020-09-19 DIAGNOSIS — R0789 Other chest pain: Secondary | ICD-10-CM

## 2020-09-19 DIAGNOSIS — R0609 Other forms of dyspnea: Secondary | ICD-10-CM

## 2020-09-20 ENCOUNTER — Other Ambulatory Visit: Payer: Self-pay

## 2020-09-20 ENCOUNTER — Ambulatory Visit
Admission: RE | Admit: 2020-09-20 | Discharge: 2020-09-20 | Disposition: A | Discharge status: Home / Self Care | Payer: 59 | Source: Ambulatory Visit | Attending: Internal Medicine | Admitting: Internal Medicine

## 2020-09-20 DIAGNOSIS — R0789 Other chest pain: Secondary | ICD-10-CM | POA: Diagnosis not present

## 2020-09-20 DIAGNOSIS — R0781 Pleurodynia: Secondary | ICD-10-CM | POA: Diagnosis not present

## 2020-09-20 DIAGNOSIS — R0600 Dyspnea, unspecified: Secondary | ICD-10-CM | POA: Diagnosis not present

## 2020-09-20 DIAGNOSIS — R0609 Other forms of dyspnea: Secondary | ICD-10-CM

## 2020-09-20 DIAGNOSIS — K449 Diaphragmatic hernia without obstruction or gangrene: Secondary | ICD-10-CM | POA: Diagnosis not present

## 2020-09-20 DIAGNOSIS — R079 Chest pain, unspecified: Secondary | ICD-10-CM | POA: Diagnosis not present

## 2020-09-20 MED ORDER — IOHEXOL 350 MG/ML SOLN
75.0000 mL | Freq: Once | INTRAVENOUS | Status: AC | PRN
  Administered 2020-09-20: 75 mL via INTRAVENOUS

## 2020-11-19 DIAGNOSIS — Z1211 Encounter for screening for malignant neoplasm of colon: Secondary | ICD-10-CM | POA: Diagnosis not present

## 2020-11-19 DIAGNOSIS — K219 Gastro-esophageal reflux disease without esophagitis: Secondary | ICD-10-CM | POA: Diagnosis not present

## 2020-11-19 DIAGNOSIS — R1314 Dysphagia, pharyngoesophageal phase: Secondary | ICD-10-CM | POA: Diagnosis not present

## 2020-11-19 DIAGNOSIS — R0789 Other chest pain: Secondary | ICD-10-CM | POA: Diagnosis not present

## 2020-11-26 ENCOUNTER — Encounter: Payer: Self-pay | Admitting: *Deleted

## 2020-11-27 ENCOUNTER — Encounter: Payer: Self-pay | Admitting: *Deleted

## 2020-11-27 ENCOUNTER — Other Ambulatory Visit: Payer: Self-pay

## 2020-11-27 ENCOUNTER — Encounter
Admission: RE | Disposition: A | Discharge status: Home / Self Care | Payer: Self-pay | Source: Home / Self Care | Attending: Gastroenterology

## 2020-11-27 ENCOUNTER — Ambulatory Visit: Payer: 59 | Admitting: Certified Registered"

## 2020-11-27 ENCOUNTER — Ambulatory Visit
Admission: RE | Admit: 2020-11-27 | Discharge: 2020-11-27 | Disposition: A | Discharge status: Home / Self Care | Payer: 59 | Attending: Gastroenterology | Admitting: Gastroenterology

## 2020-11-27 DIAGNOSIS — K317 Polyp of stomach and duodenum: Secondary | ICD-10-CM | POA: Insufficient documentation

## 2020-11-27 DIAGNOSIS — Z7951 Long term (current) use of inhaled steroids: Secondary | ICD-10-CM | POA: Diagnosis not present

## 2020-11-27 DIAGNOSIS — K2980 Duodenitis without bleeding: Secondary | ICD-10-CM | POA: Diagnosis not present

## 2020-11-27 DIAGNOSIS — R0789 Other chest pain: Secondary | ICD-10-CM | POA: Insufficient documentation

## 2020-11-27 DIAGNOSIS — K449 Diaphragmatic hernia without obstruction or gangrene: Secondary | ICD-10-CM | POA: Insufficient documentation

## 2020-11-27 DIAGNOSIS — K219 Gastro-esophageal reflux disease without esophagitis: Secondary | ICD-10-CM | POA: Insufficient documentation

## 2020-11-27 DIAGNOSIS — Z79890 Hormone replacement therapy (postmenopausal): Secondary | ICD-10-CM | POA: Insufficient documentation

## 2020-11-27 HISTORY — PX: ESOPHAGOGASTRODUODENOSCOPY: SHX5428

## 2020-11-27 SURGERY — EGD (ESOPHAGOGASTRODUODENOSCOPY)
Anesthesia: General

## 2020-11-27 MED ORDER — PROPOFOL 10 MG/ML IV BOLUS
INTRAVENOUS | Status: DC | PRN
  Administered 2020-11-27: 40 mg via INTRAVENOUS
  Administered 2020-11-27: 10 mg via INTRAVENOUS
  Administered 2020-11-27: 40 mg via INTRAVENOUS

## 2020-11-27 MED ORDER — PROPOFOL 500 MG/50ML IV EMUL
INTRAVENOUS | Status: DC | PRN
  Administered 2020-11-27: 155 ug/kg/min via INTRAVENOUS

## 2020-11-27 MED ORDER — SODIUM CHLORIDE 0.9 % IV SOLN: INTRAVENOUS | Status: DC

## 2020-11-27 MED ORDER — MIDAZOLAM HCL 2 MG/2ML IJ SOLN
INTRAMUSCULAR | Status: DC | PRN
  Administered 2020-11-27: 2 mg via INTRAVENOUS

## 2020-11-27 MED ORDER — LIDOCAINE HCL (CARDIAC) PF 100 MG/5ML IV SOSY
PREFILLED_SYRINGE | INTRAVENOUS | Status: DC | PRN
  Administered 2020-11-27: 80 mg via INTRAVENOUS
  Administered 2020-11-27: 20 mg via INTRAVENOUS

## 2020-11-27 MED ORDER — MIDAZOLAM HCL 2 MG/2ML IJ SOLN
INTRAMUSCULAR | Status: AC
  Filled 2020-11-27: qty 2

## 2020-11-27 NOTE — Op Note (Signed)
[facility] Gastroenterology Patient Name: Seth Ibarra Procedure Date: 11/27/2020 12:15 PM MRN: [id number] Account #: [id number] Date of Birth: [birth year] Admit Type: Outpatient Age: 49 Room: [facility] ENDO ROOM 1 Gender: Male Note Status: Finalized Procedure:             Upper GI endoscopy Indications:           Gastro-esophageal reflux disease, Chest pain (non                         cardiac) Providers:             Andrey Farmer MD, MD Referring MD:          Rusty Aus, MD (Referring MD) Medicines:             Monitored Anesthesia Care Complications:         No immediate complications. Estimated blood loss:                         Minimal. Procedure:             Pre-Anesthesia Assessment:                        - Prior to the procedure, a History and Physical was                         performed, and patient medications and allergies were                         reviewed. The patient is competent. The risks and                         benefits of the procedure and the sedation options and                         risks were discussed with the patient. All questions                         were answered and informed consent was obtained.                         Patient identification and proposed procedure were                         verified by the physician, the nurse, the anesthetist                         and the technician in the endoscopy suite. Mental                         Status Examination: alert and oriented. Airway                         Examination: normal oropharyngeal airway and neck                         mobility. Respiratory Examination: clear to  auscultation. CV Examination: normal. Prophylactic                         Antibiotics: The patient does not require prophylactic                         antibiotics. Prior Anticoagulants: The patient has                         taken no previous anticoagulant or  antiplatelet                         agents. ASA Grade Assessment: I - A normal, healthy                         patient. After reviewing the risks and benefits, the                         patient was deemed in satisfactory condition to                         undergo the procedure. The anesthesia plan was to use                         monitored anesthesia care (MAC). Immediately prior to                         administration of medications, the patient was                         re-assessed for adequacy to receive sedatives. The                         heart rate, respiratory rate, oxygen saturations,                         blood pressure, adequacy of pulmonary ventilation, and                         response to care were monitored throughout the                         procedure. The physical status of the patient was                         re-assessed after the procedure.                        After obtaining informed consent, the endoscope was                         passed under direct vision. Throughout the procedure,                         the patient's blood pressure, pulse, and oxygen                         saturations were monitored continuously. The Endoscope  was introduced through the mouth, and advanced to the                         second part of duodenum. The upper GI endoscopy was                         accomplished without difficulty. The patient tolerated                         the procedure well. Findings:      A 3 cm hiatal hernia was present.      The Z-line was irregular.      Normal mucosa was found in the entire esophagus. Biopsies were obtained       from the proximal and distal esophagus with cold forceps for histology       of suspected eosinophilic esophagitis. Estimated blood loss was minimal.      Multiple 3 to 10 mm semi-sessile polyps with no bleeding and no stigmata       of recent bleeding were found in the stomach.  Biopsies were taken with a       cold forceps for histology. Estimated blood loss was minimal.      Localized nodular mucosa was found in the duodenal bulb. Biopsies were       taken with a cold forceps for histology. Estimated blood loss was       minimal. Impression:            - 3 cm hiatal hernia.                        - Z-line irregular.                        - Normal mucosa was found in the entire esophagus.                         Biopsied.                        - Multiple gastric polyps. Biopsied.                        - Nodular mucosa in the duodenal bulb. Biopsied. Recommendation:        - Discharge patient to home.                        - Resume previous diet.                        - Continue present medications.                        - Await pathology results.                        - Return to referring physician as previously                         scheduled. Procedure Code(s):     --- Professional ---  50569, Esophagogastroduodenoscopy, flexible,                         transoral; with biopsy, single or multiple Diagnosis Code(s):     --- Professional ---                        K44.9, Diaphragmatic hernia without obstruction or                         gangrene                        K22.8, Other specified diseases of esophagus                        K31.7, Polyp of stomach and duodenum                        K31.89, Other diseases of stomach and duodenum                        K21.9, Gastro-esophageal reflux disease without                         esophagitis                        R07.89, Other chest pain CPT copyright 2019 American Medical Association. All rights reserved. The codes documented in this report are preliminary and upon coder review may  be revised to meet current compliance requirements. Andrey Farmer MD, MD 11/27/2020 12:33:12 PM Number of Addenda: 0 Note Initiated On: 11/27/2020 12:15 PM Estimated Blood Loss:   Estimated blood loss was minimal.      [facility]

## 2020-11-27 NOTE — Interval H&P Note (Signed)
History and Physical Interval Note:  11/27/2020 12:17 PM  Seth Ibarra  has presented today for surgery, with the diagnosis of GERD.  The various methods of treatment have been discussed with the patient and family. After consideration of risks, benefits and other options for treatment, the patient has consented to  Procedure(s): ESOPHAGOGASTRODUODENOSCOPY (EGD) (N/A) as a surgical intervention.  The patient's history has been reviewed, patient examined, no change in status, stable for surgery.  I have reviewed the patient's chart and labs.  Questions were answered to the patient's satisfaction.     Regis Bill  Ok to proceed with EGD

## 2020-11-27 NOTE — H&P (Signed)
Outpatient short stay form Pre-procedure 11/27/2020 12:12 PM Merlyn Lot MD, MPH  Primary Physician: Dr. Hyacinth Meeker  Reason for visit:  Atypical Chest pain  History of present illness:    50 y/o gentleman with atypical chest pain here for EGD. No blood thinners. Pain is left sided and is constant. No family history of GI malignancies.    Current Facility-Administered Medications:    0.9 %  sodium chloride infusion, , Intravenous, Continuous, Duane Earnshaw, Rossie Muskrat, MD, Last Rate: 20 mL/hr at 11/27/20 1206, New Bag at 11/27/20 1206  Medications Prior to Admission  Medication Sig Dispense Refill Last Dose   ADVAIR DISKUS 100-50 MCG/DOSE AEPB Inhae 1 puff bid as directed Rinse mouth after use 60 each 5 11/27/2020   albuterol (VENTOLIN HFA) 108 (90 Base) MCG/ACT inhaler Inhale 2 puffs into the lungs every 6 (six) hours as needed for wheezing or shortness of breath. 1 Inhaler 2 Past Week   diclofenac (VOLTAREN) 75 MG EC tablet Take 75 mg by mouth 2 (two) times daily.      testosterone cypionate (DEPOTESTOSTERONE CYPIONATE) 200 MG/ML injection Inject into the muscle every 14 (fourteen) days.   Past Week   dexlansoprazole (DEXILANT) 60 MG capsule Take 60 mg by mouth daily. (Patient not taking: Reported on 11/27/2020)   Not Taking   esomeprazole (NEXIUM) 40 MG capsule Take 1 capsule (40 mg total) by mouth daily. (Patient not taking: Reported on 11/27/2020) 30 capsule 3 Not Taking   sucralfate (CARAFATE) 1 g tablet Take 1 g by mouth 4 (four) times daily -  with meals and at bedtime. (Patient not taking: Reported on 11/27/2020)   Not Taking     No Known Allergies   Past Medical History:  Diagnosis Date   Asthma    ED (erectile dysfunction)    Mild   GERD (gastroesophageal reflux disease)    Hypogonadism in male    Low HDL (under 40)    Overweight    Seasonal rhinitis     Review of systems:  Otherwise negative.    Physical Exam  Gen: Alert, oriented. Appears stated age.  HEENT:  PERRLA. Lungs: No respiratory distress CV: RRR Abd: soft, benign, no masses Ext: No edema    Planned procedures: Proceed with EGD. The patient understands the nature of the planned procedure, indications, risks, alternatives and potential complications including but not limited to bleeding, infection, perforation, damage to internal organs and possible oversedation/side effects from anesthesia. The patient agrees and gives consent to proceed.  Please refer to procedure notes for findings, recommendations and patient disposition/instructions.     Merlyn Lot MD, MPH Gastroenterology 11/27/2020  12:12 PM

## 2020-11-27 NOTE — Anesthesia Postprocedure Evaluation (Signed)
Anesthesia Post Note  Patient: Seth Ibarra  Procedure(s) Performed: ESOPHAGOGASTRODUODENOSCOPY (EGD)  Patient location during evaluation: Endoscopy Anesthesia Type: General Level of consciousness: awake and alert Pain management: pain level controlled Vital Signs Assessment: post-procedure vital signs reviewed and stable Respiratory status: spontaneous breathing, nonlabored ventilation, respiratory function stable and patient connected to nasal cannula oxygen Cardiovascular status: blood pressure returned to baseline and stable Postop Assessment: no apparent nausea or vomiting Anesthetic complications: no   No notable events documented.   Last Vitals:  Vitals:   11/27/20 1152 11/27/20 1231  BP: (!) 156/107 129/85  Pulse: 98   Resp: 17   Temp: 36.4 C (!) 36.1 C  SpO2: 98%     Last Pain:  Vitals:   11/27/20 1231  TempSrc: Temporal  PainSc: Asleep                 Corinda Gubler

## 2020-11-27 NOTE — Anesthesia Preprocedure Evaluation (Signed)
Anesthesia Evaluation  Patient identified by MRN, date of birth, ID band Patient awake    Reviewed: Allergy & Precautions, NPO status , Patient's Chart, lab work & pertinent test results  History of Anesthesia Complications Negative for: history of anesthetic complications  Airway Mallampati: III  TM Distance: >3 FB Neck ROM: Full    Dental no notable dental hx. (+) Teeth Intact   Pulmonary asthma , neg sleep apnea, neg COPD, Patient abstained from smoking.Not current smoker,  Hospitalized once for asthma at age 50. Good control since then   Pulmonary exam normal breath sounds clear to auscultation       Cardiovascular Exercise Tolerance: Good METS(-) hypertension(-) CAD and (-) Past MI negative cardio ROS  (-) dysrhythmias  Rhythm:Regular Rate:Normal - Systolic murmurs    Neuro/Psych negative neurological ROS  negative psych ROS   GI/Hepatic GERD  ,(+)     (-) substance abuse  ,   Endo/Other  neg diabetes  Renal/GU negative Renal ROS     Musculoskeletal   Abdominal   Peds  Hematology   Anesthesia Other Findings Past Medical History: No date: Asthma No date: ED (erectile dysfunction)     Comment:  Mild No date: GERD (gastroesophageal reflux disease) No date: Hypogonadism in male No date: Low HDL (under 40) No date: Overweight No date: Seasonal rhinitis  Reproductive/Obstetrics                             Anesthesia Physical Anesthesia Plan  ASA: 2  Anesthesia Plan: General   Post-op Pain Management:    Induction: Intravenous  PONV Risk Score and Plan: 2 and Ondansetron, Propofol infusion and TIVA  Airway Management Planned: Nasal Cannula  Additional Equipment: None  Intra-op Plan:   Post-operative Plan:   Informed Consent: I have reviewed the patients History and Physical, chart, labs and discussed the procedure including the risks, benefits and alternatives for  the proposed anesthesia with the patient or authorized representative who has indicated his/her understanding and acceptance.     Dental advisory given  Plan Discussed with: CRNA and Surgeon  Anesthesia Plan Comments: (Discussed risks of anesthesia with patient, including possibility of difficulty with spontaneous ventilation under anesthesia necessitating airway intervention, PONV, and rare risks such as cardiac or respiratory or neurological events. Patient understands.)        Anesthesia Quick Evaluation

## 2020-11-27 NOTE — Transfer of Care (Signed)
Immediate Anesthesia Transfer of Care Note  Patient: Seth Ibarra  Procedure(s) Performed: ESOPHAGOGASTRODUODENOSCOPY (EGD)  Patient Location: Endoscopy Unit  Anesthesia Type:General  Level of Consciousness: awake, drowsy and patient cooperative  Airway & Oxygen Therapy: Patient Spontanous Breathing and Patient connected to face mask oxygen  Post-op Assessment: Report given to RN and Post -op Vital signs reviewed and stable  Post vital signs: Reviewed and stable  Last Vitals:  Vitals Value Taken Time  BP 129/85 11/27/20 1232  Temp    Pulse 99 11/27/20 1233  Resp 20 11/27/20 1233  SpO2 99 % 11/27/20 1233  Vitals shown include unvalidated device data.  Last Pain:  Vitals:   11/27/20 1231  TempSrc: Temporal  PainSc: Asleep         Complications: No notable events documented.

## 2020-11-28 ENCOUNTER — Encounter: Payer: Self-pay | Admitting: Gastroenterology

## 2020-11-28 LAB — SURGICAL PATHOLOGY

## 2020-12-20 ENCOUNTER — Encounter: Payer: Self-pay | Admitting: Physical Medicine and Rehabilitation

## 2020-12-21 DIAGNOSIS — R059 Cough, unspecified: Secondary | ICD-10-CM | POA: Diagnosis not present

## 2020-12-21 DIAGNOSIS — J069 Acute upper respiratory infection, unspecified: Secondary | ICD-10-CM | POA: Diagnosis not present

## 2021-01-07 DIAGNOSIS — K219 Gastro-esophageal reflux disease without esophagitis: Secondary | ICD-10-CM | POA: Diagnosis not present

## 2021-01-07 DIAGNOSIS — K589 Irritable bowel syndrome without diarrhea: Secondary | ICD-10-CM | POA: Diagnosis not present

## 2021-01-07 DIAGNOSIS — R0789 Other chest pain: Secondary | ICD-10-CM | POA: Diagnosis not present

## 2021-01-07 DIAGNOSIS — K2980 Duodenitis without bleeding: Secondary | ICD-10-CM | POA: Diagnosis not present

## 2021-01-23 ENCOUNTER — Other Ambulatory Visit: Payer: Self-pay

## 2021-01-23 ENCOUNTER — Encounter: Payer: 59 | Attending: Physical Medicine and Rehabilitation | Admitting: Physical Medicine and Rehabilitation

## 2021-01-23 ENCOUNTER — Encounter: Payer: Self-pay | Admitting: Physical Medicine and Rehabilitation

## 2021-01-23 VITALS — BP 125/87 | HR 102 | Temp 98.9°F | Ht 68.0 in | Wt 207.0 lb

## 2021-01-23 DIAGNOSIS — G522 Disorders of vagus nerve: Secondary | ICD-10-CM | POA: Insufficient documentation

## 2021-01-23 DIAGNOSIS — R0782 Intercostal pain: Secondary | ICD-10-CM | POA: Diagnosis not present

## 2021-01-23 MED ORDER — LIDOCAINE 5 % EX PTCH: 1.0000 | MEDICATED_PATCH | CUTANEOUS | 0 refills | Status: DC

## 2021-01-23 NOTE — Progress Notes (Signed)
Subjective:    Patient ID: Seth Ibarra, male    DOB: 12/18/1970, 50 y.o.   MRN: [id number]  HPI Seth Ibarra is a 50 year old man who presents to establish care for chest pain.   -He notes no inciting event -He dies have a history of neck pain for which he had EMG/NCS that was negative except for carpal tunnel syndrom -he had a CT chest that was normal -he had an endoscopy that was normal.  -pain feels constant -has not worked out since September since he did not want to make things worse -he works for the city  -he has never tried lidocaine patches -this pain seemed to start with  -pain feels dull, constant    Pain Inventory Average Pain 4 Pain Right Now 3 My pain is constant and dull  In the last 24 hours, has pain interfered with the following? General activity 3 Relation with others 3 Enjoyment of life 3 What TIME of day is your pain at its worst? morning , daytime, evening, and night Sleep (in general) Fair  Pain is worse with:  none Pain improves with:  none Relief from Meds: 0  walk without assistance ability to climb steps?  yes do you drive?  yes  employed # of hrs/week 40 what is your job? [place]   No problems in this area  new  new    No family history on file. Social History   Socioeconomic History   Marital status: Single    Spouse name: Not on file   Number of children: Not on file   Years of education: Not on file   Highest education level: Not on file  Occupational History   Not on file  Tobacco Use   Smoking status: Never   Smokeless tobacco: Never  Vaping Use   Vaping Use: Never used  Substance and Sexual Activity   Alcohol use: Not Currently   Drug use: Never   Sexual activity: Not on file  Other Topics Concern   Not on file  Social History Narrative   Not on file   Social Determinants of Health   Financial Resource Strain: Not on file  Food Insecurity: Not on file  Transportation Needs: Not on file   Physical Activity: Not on file  Stress: Not on file  Social Connections: Not on file   Past Surgical History:  Procedure Laterality Date   ESOPHAGOGASTRODUODENOSCOPY N/A 11/27/2020   Procedure: ESOPHAGOGASTRODUODENOSCOPY (EGD);  Surgeon: Regis Bill, MD;  Location: [facility] ENDOSCOPY;  Service: Endoscopy;  Laterality: N/A;   Past Medical History:  Diagnosis Date   Asthma    ED (erectile dysfunction)    Mild   GERD (gastroesophageal reflux disease)    Hypogonadism in male    Low HDL (under 40)    Overweight    Seasonal rhinitis    BP 125/87   Pulse (!) 102   Temp 98.9 F (37.2 C)   Ht 5\' 8"  (1.727 m)   Wt 207 lb (93.9 kg)   SpO2 96%   BMI 31.47 kg/m   Opioid Risk Score:   Fall Risk Score:  `1  Depression screen PHQ 2/9  Depression screen PHQ 2/9 01/23/2021  Decreased Interest 1  Down, Depressed, Hopeless 0  PHQ - 2 Score 1  Altered sleeping 0  Tired, decreased energy 1  Change in appetite 1  Feeling bad or failure about yourself  0  Trouble concentrating 0  Moving slowly or fidgety/restless  0  Suicidal thoughts 0  PHQ-9 Score 3  Difficult doing work/chores Not difficult at all    Review of Systems  Constitutional: Negative.   HENT: Negative.    Eyes: Negative.   Cardiovascular: Negative.   Gastrointestinal: Negative.   Endocrine: Negative.   Genitourinary: Negative.   Musculoskeletal: Negative.   Skin: Negative.   Allergic/Immunologic: Negative.   Hematological: Negative.   Psychiatric/Behavioral: Negative.    All other systems reviewed and are negative.     Objective:   Physical Exam Gen: no distress, normal appearing HEENT: oral mucosa pink and moist, NCAT Cardio: Reg rate Chest: normal effort, normal rate of breathing Abd: soft, non-distended Ext: no edema Psych: pleasant, normal affect Skin: intact Neuro: Alert and oriented Musculoskeletal: Nontender to palpation    Assessment & Plan:  1) Chronic Pain Syndrome secondary to  musculoskeletal chest pain.  -Discussed current symptoms of pain and history of pain.  -Discussed benefits of exercise in reducing pain. -Lidocaine 5% patch prescribed.  -Discussed following foods that may reduce pain: 1) Ginger (especially studied for arthritis)- reduce leukotriene production to decrease inflammation 2) Blueberries- high in phytonutrients that decrease inflammation 3) Salmon- marine omega-3s reduce joint swelling and pain 4) Pumpkin seeds- reduce inflammation 5) dark chocolate- reduces inflammation 6) turmeric- reduces inflammation 7) tart cherries - reduce pain and stiffness 8) extra virgin olive oil - its compound olecanthal helps to block prostaglandins  9) chili peppers- can be eaten or applied topically via capsaicin 10) mint- helpful for headache, muscle aches, joint pain, and itching 11) garlic- reduces inflammation  Link to further information on diet for chronic pain: [email or web address]

## 2021-01-23 NOTE — Patient Instructions (Signed)
-  Discussed following foods that may reduce pain: 1) Ginger (especially studied for arthritis)- reduce leukotriene production to decrease inflammation 2) Blueberries- high in phytonutrients that decrease inflammation 3) Salmon- marine omega-3s reduce joint swelling and pain 4) Pumpkin seeds- reduce inflammation 5) dark chocolate- reduces inflammation 6) turmeric- reduces inflammation 7) tart cherries - reduce pain and stiffness 8) extra virgin olive oil - its compound olecanthal helps to block prostaglandins  9) chili peppers- can be eaten or applied topically via capsaicin 10) mint- helpful for headache, muscle aches, joint pain, and itching 11) garlic- reduces inflammation  Link to further information on diet for chronic pain: [email or web address]  

## 2021-02-08 ENCOUNTER — Other Ambulatory Visit: Payer: Self-pay

## 2021-02-08 ENCOUNTER — Telehealth: Payer: Self-pay

## 2021-02-08 ENCOUNTER — Encounter (HOSPITAL_BASED_OUTPATIENT_CLINIC_OR_DEPARTMENT_OTHER): Payer: 59 | Admitting: Physical Medicine and Rehabilitation

## 2021-02-08 DIAGNOSIS — G522 Disorders of vagus nerve: Secondary | ICD-10-CM | POA: Diagnosis not present

## 2021-02-08 DIAGNOSIS — R0782 Intercostal pain: Secondary | ICD-10-CM

## 2021-02-08 MED ORDER — SILDENAFIL CITRATE 25 MG PO TABS: 25.0000 mg | ORAL_TABLET | Freq: Every day | ORAL | 3 refills | Status: DC | PRN

## 2021-02-08 NOTE — Progress Notes (Signed)
Subjective:    Patient ID: Seth Ibarra, male    DOB: [birth year], 50 y.o.   MRN: [id number]  HPI An audio/video tele-health visit is felt to be the most appropriate encounter for this patient at this time.  This is a follow up tele-visit via phonw The patient is at home. MD is at office.    Seth Ibarra is a 50 year old man who presents for follow-up of chest pain.   -He notes no inciting event -He dies have a history of neck pain for which he had EMG/NCS that was negative except for carpal tunnel syndrom -he had a CT chest that was normal -he had an endoscopy that was normal.  -pain feels constant -has not worked out since September since he did not want to make things worse -he works for the city  -he has never tried lidocaine patches -this pain seemed to start with  -pain feels dull, constant -lidocaine patches did not help -has been seeing a chiropractor and was told that his symptoms of abdominal bloating, difficulty swallowing, and pain could be associated with impaired vagal nerve function -he asks what next steps are.  -he has never tried sildenafil before    Pain Inventory Average Pain 4 Pain Right Now 3 My pain is constant and dull  In the last 24 hours, has pain interfered with the following? General activity 3 Relation with others 3 Enjoyment of life 3 What TIME of day is your pain at its worst? morning , daytime, evening, and night Sleep (in general) Fair  Pain is worse with:  none Pain improves with:  none Relief from Meds: 0  walk without assistance ability to climb steps?  yes do you drive?  yes  employed # of hrs/week 40 what is your job? [place]  No problems in this area  new  new    No family history on file. Social History   Socioeconomic History   Marital status: Single    Spouse name: Not on file   Number of children: Not on file   Years of education: Not on file   Highest education level: Not on file  Occupational  History   Not on file  Tobacco Use   Smoking status: Never   Smokeless tobacco: Never  Vaping Use   Vaping Use: Never used  Substance and Sexual Activity   Alcohol use: Not Currently   Drug use: Never   Sexual activity: Not on file  Other Topics Concern   Not on file  Social History Narrative   Not on file   Social Determinants of Health   Financial Resource Strain: Not on file  Food Insecurity: Not on file  Transportation Needs: Not on file  Physical Activity: Not on file  Stress: Not on file  Social Connections: Not on file   Past Surgical History:  Procedure Laterality Date   ESOPHAGOGASTRODUODENOSCOPY N/A 11/27/2020   Procedure: ESOPHAGOGASTRODUODENOSCOPY (EGD);  Surgeon: Regis Bill, MD;  Location: [facility] ENDOSCOPY;  Service: Endoscopy;  Laterality: N/A;   Past Medical History:  Diagnosis Date   Asthma    ED (erectile dysfunction)    Mild   GERD (gastroesophageal reflux disease)    Hypogonadism in male    Low HDL (under 40)    Overweight    Seasonal rhinitis    There were no vitals taken for this visit.  Opioid Risk Score:   Fall Risk Score:  `1  Depression screen [facility] 2/9  Depression screen PHQ 2/9 01/23/2021  Decreased Interest 1  Down, Depressed, Hopeless 0  PHQ - 2 Score 1  Altered sleeping 0  Tired, decreased energy 1  Change in appetite 1  Feeling bad or failure about yourself  0  Trouble concentrating 0  Moving slowly or fidgety/restless 0  Suicidal thoughts 0  PHQ-9 Score 3  Difficult doing work/chores Not difficult at all    Review of Systems  Constitutional: Negative.   HENT: Negative.    Eyes: Negative.   Cardiovascular: Negative.   Gastrointestinal: Negative.   Endocrine: Negative.   Genitourinary: Negative.   Musculoskeletal: Negative.   Skin: Negative.   Allergic/Immunologic: Negative.   Hematological: Negative.   Psychiatric/Behavioral: Negative.    All other systems reviewed and are negative.     Objective:    Physical Exam Not performed as patient was seen via phone.     Assessment & Plan:  1) Chronic Pain Syndrome secondary to chest pain, may be secondary to vagal nerve irritation -Discussed current symptoms of pain and history of pain.  -Discussed benefits of exercise in reducing pain. -Lidocaine 5% patch prescribed- did not help -prescribed sildenafil and discussed foods that can naturally increase nitric oxide production.  -Discussed following foods that may reduce pain: 1) Ginger (especially studied for arthritis)- reduce leukotriene production to decrease inflammation 2) Blueberries- high in phytonutrients that decrease inflammation 3) Salmon- marine omega-3s reduce joint swelling and pain 4) Pumpkin seeds- reduce inflammation 5) dark chocolate- reduces inflammation 6) turmeric- reduces inflammation 7) tart cherries - reduce pain and stiffness 8) extra virgin olive oil - its compound olecanthal helps to block prostaglandins  9) chili peppers- can be eaten or applied topically via capsaicin 10) mint- helpful for headache, muscle aches, joint pain, and itching 11) garlic- reduces inflammation  Link to further information on diet for chronic pain: [email or web address]   5 minutes spent in discussing patient's pain, prescribing sildenafil- discussing to take a night, discussing side effects of headache, discussing behavioral modifications to help reduce vagal nerve irritation

## 2021-02-08 NOTE — Telephone Encounter (Signed)
Sildenafil PA in progress

## 2021-02-14 NOTE — Telephone Encounter (Signed)
Rx only approved for # 6 tablets. Patient picked up RX.

## 2021-04-09 ENCOUNTER — Ambulatory Visit: Payer: 59 | Admitting: Physical Medicine and Rehabilitation

## 2021-04-10 ENCOUNTER — Ambulatory Visit: Payer: 59 | Admitting: Physical Medicine and Rehabilitation

## 2021-04-16 DIAGNOSIS — E569 Vitamin deficiency, unspecified: Secondary | ICD-10-CM | POA: Diagnosis not present

## 2021-04-16 DIAGNOSIS — M792 Neuralgia and neuritis, unspecified: Secondary | ICD-10-CM | POA: Diagnosis not present

## 2021-04-16 DIAGNOSIS — R200 Anesthesia of skin: Secondary | ICD-10-CM | POA: Diagnosis not present

## 2021-04-16 DIAGNOSIS — Z114 Encounter for screening for human immunodeficiency virus [HIV]: Secondary | ICD-10-CM | POA: Diagnosis not present

## 2021-04-22 DIAGNOSIS — E569 Vitamin deficiency, unspecified: Secondary | ICD-10-CM | POA: Diagnosis not present

## 2021-04-22 DIAGNOSIS — R200 Anesthesia of skin: Secondary | ICD-10-CM | POA: Diagnosis not present

## 2021-05-08 DIAGNOSIS — R7989 Other specified abnormal findings of blood chemistry: Secondary | ICD-10-CM | POA: Diagnosis not present

## 2021-05-08 DIAGNOSIS — Z125 Encounter for screening for malignant neoplasm of prostate: Secondary | ICD-10-CM | POA: Diagnosis not present

## 2021-05-08 DIAGNOSIS — Z0000 Encounter for general adult medical examination without abnormal findings: Secondary | ICD-10-CM | POA: Diagnosis not present

## 2021-05-15 DIAGNOSIS — M792 Neuralgia and neuritis, unspecified: Secondary | ICD-10-CM | POA: Diagnosis not present

## 2021-05-15 DIAGNOSIS — R7989 Other specified abnormal findings of blood chemistry: Secondary | ICD-10-CM | POA: Diagnosis not present

## 2021-05-15 DIAGNOSIS — Z0000 Encounter for general adult medical examination without abnormal findings: Secondary | ICD-10-CM | POA: Diagnosis not present

## 2021-06-03 DIAGNOSIS — R768 Other specified abnormal immunological findings in serum: Secondary | ICD-10-CM | POA: Insufficient documentation

## 2021-06-03 DIAGNOSIS — M792 Neuralgia and neuritis, unspecified: Secondary | ICD-10-CM | POA: Diagnosis not present

## 2021-06-03 DIAGNOSIS — M5030 Other cervical disc degeneration, unspecified cervical region: Secondary | ICD-10-CM | POA: Insufficient documentation

## 2021-06-26 DIAGNOSIS — M542 Cervicalgia: Secondary | ICD-10-CM | POA: Diagnosis not present

## 2021-06-26 DIAGNOSIS — K5900 Constipation, unspecified: Secondary | ICD-10-CM | POA: Diagnosis not present

## 2021-10-01 DIAGNOSIS — M792 Neuralgia and neuritis, unspecified: Secondary | ICD-10-CM | POA: Diagnosis not present

## 2021-10-01 DIAGNOSIS — H02402 Unspecified ptosis of left eyelid: Secondary | ICD-10-CM | POA: Diagnosis not present

## 2021-10-01 DIAGNOSIS — R200 Anesthesia of skin: Secondary | ICD-10-CM | POA: Diagnosis not present

## 2021-10-04 ENCOUNTER — Other Ambulatory Visit: Payer: Self-pay | Admitting: Student

## 2021-10-04 DIAGNOSIS — H02402 Unspecified ptosis of left eyelid: Secondary | ICD-10-CM

## 2021-10-04 DIAGNOSIS — M792 Neuralgia and neuritis, unspecified: Secondary | ICD-10-CM

## 2021-10-14 ENCOUNTER — Ambulatory Visit
Admission: RE | Admit: 2021-10-14 | Discharge: 2021-10-14 | Disposition: A | Discharge status: Home / Self Care | Payer: 59 | Source: Ambulatory Visit | Attending: Student | Admitting: Student

## 2021-10-14 DIAGNOSIS — M792 Neuralgia and neuritis, unspecified: Secondary | ICD-10-CM

## 2021-10-14 DIAGNOSIS — R519 Headache, unspecified: Secondary | ICD-10-CM | POA: Diagnosis not present

## 2021-10-14 DIAGNOSIS — H02402 Unspecified ptosis of left eyelid: Secondary | ICD-10-CM

## 2021-10-14 DIAGNOSIS — J320 Chronic maxillary sinusitis: Secondary | ICD-10-CM | POA: Diagnosis not present

## 2021-10-24 DIAGNOSIS — R768 Other specified abnormal immunological findings in serum: Secondary | ICD-10-CM | POA: Diagnosis not present

## 2021-10-24 DIAGNOSIS — M5481 Occipital neuralgia: Secondary | ICD-10-CM | POA: Diagnosis not present

## 2021-10-24 DIAGNOSIS — H02402 Unspecified ptosis of left eyelid: Secondary | ICD-10-CM | POA: Diagnosis not present

## 2021-10-24 DIAGNOSIS — M792 Neuralgia and neuritis, unspecified: Secondary | ICD-10-CM | POA: Diagnosis not present

## 2021-10-24 DIAGNOSIS — E569 Vitamin deficiency, unspecified: Secondary | ICD-10-CM | POA: Diagnosis not present

## 2021-10-28 DIAGNOSIS — M542 Cervicalgia: Secondary | ICD-10-CM | POA: Diagnosis not present

## 2021-10-28 DIAGNOSIS — M62830 Muscle spasm of back: Secondary | ICD-10-CM | POA: Diagnosis not present

## 2021-10-28 DIAGNOSIS — M9902 Segmental and somatic dysfunction of thoracic region: Secondary | ICD-10-CM | POA: Diagnosis not present

## 2021-10-28 DIAGNOSIS — M9901 Segmental and somatic dysfunction of cervical region: Secondary | ICD-10-CM | POA: Diagnosis not present

## 2021-10-29 DIAGNOSIS — M9901 Segmental and somatic dysfunction of cervical region: Secondary | ICD-10-CM | POA: Diagnosis not present

## 2021-10-29 DIAGNOSIS — M542 Cervicalgia: Secondary | ICD-10-CM | POA: Diagnosis not present

## 2021-10-29 DIAGNOSIS — M9902 Segmental and somatic dysfunction of thoracic region: Secondary | ICD-10-CM | POA: Diagnosis not present

## 2021-10-29 DIAGNOSIS — M62830 Muscle spasm of back: Secondary | ICD-10-CM | POA: Diagnosis not present

## 2021-10-30 DIAGNOSIS — M62830 Muscle spasm of back: Secondary | ICD-10-CM | POA: Diagnosis not present

## 2021-10-30 DIAGNOSIS — M542 Cervicalgia: Secondary | ICD-10-CM | POA: Diagnosis not present

## 2021-10-30 DIAGNOSIS — M9901 Segmental and somatic dysfunction of cervical region: Secondary | ICD-10-CM | POA: Diagnosis not present

## 2021-10-30 DIAGNOSIS — M9902 Segmental and somatic dysfunction of thoracic region: Secondary | ICD-10-CM | POA: Diagnosis not present

## 2021-11-04 DIAGNOSIS — M62830 Muscle spasm of back: Secondary | ICD-10-CM | POA: Diagnosis not present

## 2021-11-04 DIAGNOSIS — M542 Cervicalgia: Secondary | ICD-10-CM | POA: Diagnosis not present

## 2021-11-04 DIAGNOSIS — M9901 Segmental and somatic dysfunction of cervical region: Secondary | ICD-10-CM | POA: Diagnosis not present

## 2021-11-04 DIAGNOSIS — M9902 Segmental and somatic dysfunction of thoracic region: Secondary | ICD-10-CM | POA: Diagnosis not present

## 2021-11-05 DIAGNOSIS — M9901 Segmental and somatic dysfunction of cervical region: Secondary | ICD-10-CM | POA: Diagnosis not present

## 2021-11-05 DIAGNOSIS — M542 Cervicalgia: Secondary | ICD-10-CM | POA: Diagnosis not present

## 2021-11-05 DIAGNOSIS — M62830 Muscle spasm of back: Secondary | ICD-10-CM | POA: Diagnosis not present

## 2021-11-05 DIAGNOSIS — M9902 Segmental and somatic dysfunction of thoracic region: Secondary | ICD-10-CM | POA: Diagnosis not present

## 2021-11-06 DIAGNOSIS — M542 Cervicalgia: Secondary | ICD-10-CM | POA: Diagnosis not present

## 2021-11-06 DIAGNOSIS — M62830 Muscle spasm of back: Secondary | ICD-10-CM | POA: Diagnosis not present

## 2021-11-06 DIAGNOSIS — M9901 Segmental and somatic dysfunction of cervical region: Secondary | ICD-10-CM | POA: Diagnosis not present

## 2021-11-06 DIAGNOSIS — M9902 Segmental and somatic dysfunction of thoracic region: Secondary | ICD-10-CM | POA: Diagnosis not present

## 2021-11-11 DIAGNOSIS — M9901 Segmental and somatic dysfunction of cervical region: Secondary | ICD-10-CM | POA: Diagnosis not present

## 2021-11-11 DIAGNOSIS — M542 Cervicalgia: Secondary | ICD-10-CM | POA: Diagnosis not present

## 2021-11-11 DIAGNOSIS — M62830 Muscle spasm of back: Secondary | ICD-10-CM | POA: Diagnosis not present

## 2021-11-11 DIAGNOSIS — M9902 Segmental and somatic dysfunction of thoracic region: Secondary | ICD-10-CM | POA: Diagnosis not present

## 2021-11-12 DIAGNOSIS — M9901 Segmental and somatic dysfunction of cervical region: Secondary | ICD-10-CM | POA: Diagnosis not present

## 2021-11-12 DIAGNOSIS — M9902 Segmental and somatic dysfunction of thoracic region: Secondary | ICD-10-CM | POA: Diagnosis not present

## 2021-11-12 DIAGNOSIS — M792 Neuralgia and neuritis, unspecified: Secondary | ICD-10-CM | POA: Diagnosis not present

## 2021-11-12 DIAGNOSIS — R7989 Other specified abnormal findings of blood chemistry: Secondary | ICD-10-CM | POA: Diagnosis not present

## 2021-11-12 DIAGNOSIS — M62830 Muscle spasm of back: Secondary | ICD-10-CM | POA: Diagnosis not present

## 2021-11-12 DIAGNOSIS — M542 Cervicalgia: Secondary | ICD-10-CM | POA: Diagnosis not present

## 2021-11-13 DIAGNOSIS — M9901 Segmental and somatic dysfunction of cervical region: Secondary | ICD-10-CM | POA: Diagnosis not present

## 2021-11-13 DIAGNOSIS — M9902 Segmental and somatic dysfunction of thoracic region: Secondary | ICD-10-CM | POA: Diagnosis not present

## 2021-11-13 DIAGNOSIS — M62830 Muscle spasm of back: Secondary | ICD-10-CM | POA: Diagnosis not present

## 2021-11-13 DIAGNOSIS — M542 Cervicalgia: Secondary | ICD-10-CM | POA: Diagnosis not present

## 2021-11-18 DIAGNOSIS — M792 Neuralgia and neuritis, unspecified: Secondary | ICD-10-CM | POA: Diagnosis not present

## 2021-11-18 DIAGNOSIS — R7989 Other specified abnormal findings of blood chemistry: Secondary | ICD-10-CM | POA: Diagnosis not present

## 2021-11-18 DIAGNOSIS — M5010 Cervical disc disorder with radiculopathy, unspecified cervical region: Secondary | ICD-10-CM | POA: Diagnosis not present

## 2021-12-02 DIAGNOSIS — M9902 Segmental and somatic dysfunction of thoracic region: Secondary | ICD-10-CM | POA: Diagnosis not present

## 2021-12-02 DIAGNOSIS — M62830 Muscle spasm of back: Secondary | ICD-10-CM | POA: Diagnosis not present

## 2021-12-02 DIAGNOSIS — M542 Cervicalgia: Secondary | ICD-10-CM | POA: Diagnosis not present

## 2021-12-02 DIAGNOSIS — M9901 Segmental and somatic dysfunction of cervical region: Secondary | ICD-10-CM | POA: Diagnosis not present

## 2021-12-03 DIAGNOSIS — M62830 Muscle spasm of back: Secondary | ICD-10-CM | POA: Diagnosis not present

## 2021-12-03 DIAGNOSIS — M9902 Segmental and somatic dysfunction of thoracic region: Secondary | ICD-10-CM | POA: Diagnosis not present

## 2021-12-03 DIAGNOSIS — M542 Cervicalgia: Secondary | ICD-10-CM | POA: Diagnosis not present

## 2021-12-03 DIAGNOSIS — M9901 Segmental and somatic dysfunction of cervical region: Secondary | ICD-10-CM | POA: Diagnosis not present

## 2021-12-05 DIAGNOSIS — M62830 Muscle spasm of back: Secondary | ICD-10-CM | POA: Diagnosis not present

## 2021-12-05 DIAGNOSIS — M9901 Segmental and somatic dysfunction of cervical region: Secondary | ICD-10-CM | POA: Diagnosis not present

## 2021-12-05 DIAGNOSIS — M9902 Segmental and somatic dysfunction of thoracic region: Secondary | ICD-10-CM | POA: Diagnosis not present

## 2021-12-05 DIAGNOSIS — M542 Cervicalgia: Secondary | ICD-10-CM | POA: Diagnosis not present

## 2021-12-13 DIAGNOSIS — M792 Neuralgia and neuritis, unspecified: Secondary | ICD-10-CM | POA: Diagnosis not present

## 2022-01-09 ENCOUNTER — Ambulatory Visit (INDEPENDENT_AMBULATORY_CARE_PROVIDER_SITE_OTHER): Payer: 59 | Admitting: Neurosurgery

## 2022-01-09 ENCOUNTER — Encounter: Payer: Self-pay | Admitting: Neurosurgery

## 2022-01-09 VITALS — BP 134/92 | HR 87 | Ht 68.0 in | Wt 203.4 lb

## 2022-01-09 DIAGNOSIS — M5481 Occipital neuralgia: Secondary | ICD-10-CM | POA: Diagnosis not present

## 2022-01-09 DIAGNOSIS — R519 Headache, unspecified: Secondary | ICD-10-CM

## 2022-01-09 NOTE — Progress Notes (Signed)
Referring Physician:  Danella Penton, MD [address] [place] [facility] [place],  [place] [address]  Primary Physician:  Danella Penton, MD  History of Present Illness: 01/09/2022 [name] is a 51 y.o  here today at the request of Dr. Hyacinth Meeker for further evaluation of his cervical spine.  The patient states he has had some difficulty with neck pain and pain that radiated to his left chest wall which started about 3 years ago without any particular inciting event.  At that time he was seen by Dr. Adriana Simas who recommended conservative treatment options.  He states that since then his chest pain has improved but he has had persistent left sided skull base pain which is worse with rotation to the left.  He denies any radicular symptoms other than some intermittent numbness into his left hand but this does not radiate down his left arm.  He denies any similar right-sided symptoms.  He has undergone multiple conservative treatment options including chiropractic adjustments, dry needling, acupuncture, physical therapy, and medication management.   Conservative measures:  Physical therapy: In 2022 Multimodal medical therapy including regular antiinflammatories: Voltaren, Lidoderm Injections: Multiple epidural steroid injections 07/05/2020: Left C3-4 transforaminal ESI (30% relief x5 days) 04/03/2020: Left C5-6 transforaminal ESI (no relief)   Seth Ibarra has no symptoms of cervical myelopathy.  The symptoms are causing a significant impact on the patient's life.   Review of Systems:  A 10 point review of systems is negative, except for the pertinent positives and negatives detailed in the HPI.  Past Medical History: Past Medical History:  Diagnosis Date   Asthma    ED (erectile dysfunction)    Mild   GERD (gastroesophageal reflux disease)    Hypogonadism in male    Low HDL (under 40)    Overweight    Seasonal rhinitis     Past Surgical History: Past  Surgical History:  Procedure Laterality Date   ESOPHAGOGASTRODUODENOSCOPY N/A 11/27/2020   Procedure: ESOPHAGOGASTRODUODENOSCOPY (EGD);  Surgeon: Regis Bill, MD;  Location: [facility] ENDOSCOPY;  Service: Endoscopy;  Laterality: N/A;    Allergies: Allergies as of 01/09/2022   (No Known Allergies)    Medications: Outpatient Encounter Medications as of 01/09/2022  Medication Sig   DULoxetine (CYMBALTA) 20 MG capsule Take 1 capsule by mouth every morning.   ADVAIR DISKUS 100-50 MCG/DOSE AEPB Inhae 1 puff bid as directed Rinse mouth after use   albuterol (VENTOLIN HFA) 108 (90 Base) MCG/ACT inhaler Inhale 2 puffs into the lungs every 6 (six) hours as needed for wheezing or shortness of breath.   diclofenac (VOLTAREN) 75 MG EC tablet Take 75 mg by mouth 2 (two) times daily.   esomeprazole (NEXIUM) 40 MG capsule Take 1 capsule (40 mg total) by mouth daily.   testosterone cypionate (DEPOTESTOSTERONE CYPIONATE) 200 MG/ML injection Inject into the muscle every 14 (fourteen) days.   [DISCONTINUED] dexlansoprazole (DEXILANT) 60 MG capsule Take 60 mg by mouth daily.   [DISCONTINUED] lidocaine (LIDODERM) 5 % Place 1 patch onto the skin daily. Remove & Discard patch within 12 hours or as directed by MD   [DISCONTINUED] sildenafil (VIAGRA) 25 MG tablet Take 1 tablet (25 mg total) by mouth daily as needed (chest pain, vagal nerve irritation).   [DISCONTINUED] sucralfate (CARAFATE) 1 g tablet Take 1 g by mouth 4 (four) times daily -  with meals and at bedtime.   No facility-administered encounter medications on file as of 01/09/2022.    Social History:  Social History   Tobacco Use   Smoking status: Never   Smokeless tobacco: Never  Vaping Use   Vaping Use: Never used  Substance Use Topics   Alcohol use: Not Currently   Drug use: Never    Family Medical History: No family history on file.  Physical Examination: @VITALWITHPAIN @  General: Patient is well developed, well nourished, calm,  collected, and in no apparent distress. Attention to examination is appropriate.  Psychiatric: Patient is non-anxious.  Head:  Pupils equal, round, and reactive to light.  ENT:  Oral mucosa appears well hydrated.  Neck:   Supple.  Limited rotation to the left.  Respiratory: Patient is breathing without any difficulty.  Extremities: No edema.  Vascular: Palpable dorsal pedal pulses.  Skin:   On exposed skin, there are no abnormal skin lesions.  NEUROLOGICAL:     Awake, alert, oriented to person, place, and time.  Speech is clear and fluent. Fund of knowledge is appropriate.   Cranial Nerves: Pupils equal round and reactive to light.  Facial tone is symmetric.  Facial sensation is symmetric. Shoulder shrug is symmetric. Tongue protrusion is midline.  There is no pronator drift.  ROM of spine: full except decreased range of motion with rotation of the neck to the left palpation of spine: non tender.    Strength: Side Biceps Triceps Deltoid Interossei Grip Wrist Ext. Wrist Flex.  R 5 5 5 5 5 5 5   L 5 5 5 5 5 5 5    Side Iliopsoas Quads Hamstring PF DF EHL  R 5 5 5 5 5 5   L 5 5 5 5 5 5    Reflexes are 2+ and symmetric at the biceps, triceps, brachioradialis, patella and achilles.   Hoffman's is absent.  Clonus is not present.  Toes are down-going.  Bilateral upper and lower extremity sensation is intact to light touch.    Gait is normal.  Medical Decision Making  Imaging: MRI C spine 12/27/19 IMPRESSION: Mild to moderate spinal canal and moderate bilateral neural foraminal narrowing at the C5-6 level.   Moderate left C3-4 and right C4-5 neural foraminal narrowing.     Electronically Signed   By: M.D.   On: 12/27/2019 13:46  MRI brain 10/14/21 IMPRESSION: MRI brain:   1. No evidence of acute intracranial abnormality. 2. 2 mm T2 FLAIR hyperintense remote insult within the left parietal lobe white matter, nonspecific and of doubtful clinical  significance as an isolated white matter focus. 3. Right maxillary sinusitis.   MRA head:   1. No intracranial large vessel occlusion or proximal high-grade arterial stenosis. 2. No intracranial aneurysm is identified.     Electronically Signed   By: D.O.   On: 10/14/2021 12:35  I have personally reviewed the images and agree with the above interpretation.  Assessment and Plan: Mr. Dumond is a pleasant 51 y.o. male with longstanding history of occipital cervical complaints with persistent left-sided occipital pain with rotation of his head to the left.  I am concerned that he has some underlying C1-2 irritation explains this persistent pain.  I would like for him to undergo a C1 2 injection.  I will obtain cervical flex/ex and reach out to Dr. 12/14/21 and Dr. Jackey Loge to hopefully have this accomplished.  He has had an MRA however we may obtain a CTA should they find this to be warranted.  He will contact 12/14/2021 should these modalities be ineffective and we will discuss further options.  I will otherwise see him going forward on an as-needed basis.  He expressed understanding was in agreement with this plan.  Thank you for involving me in the care of this patient.   I spent a total of 45 minutes in both face-to-face and non-face-to-face activities for this visit on the date of this encounter including review of outside records, review of imaging, review of symptoms, physical exam, discussion of differential diagnosis, communication with Dr. Leeroy Bock and Dr. Gerrit Heck, placing orders, and documentation.  [facility] Dept. of Neurosurgery

## 2022-01-10 ENCOUNTER — Other Ambulatory Visit: Payer: Self-pay | Admitting: Neurosurgery

## 2022-01-10 ENCOUNTER — Ambulatory Visit
Admission: RE | Admit: 2022-01-10 | Discharge: 2022-01-10 | Disposition: A | Discharge status: Home / Self Care | Payer: 59 | Source: Ambulatory Visit | Attending: Internal Medicine | Admitting: Internal Medicine

## 2022-01-10 ENCOUNTER — Ambulatory Visit
Admission: RE | Admit: 2022-01-10 | Discharge: 2022-01-10 | Disposition: A | Discharge status: Home / Self Care | Payer: 59 | Source: Ambulatory Visit | Attending: Neurosurgery | Admitting: Neurosurgery

## 2022-01-10 DIAGNOSIS — R519 Headache, unspecified: Secondary | ICD-10-CM

## 2022-01-10 DIAGNOSIS — M47812 Spondylosis without myelopathy or radiculopathy, cervical region: Secondary | ICD-10-CM | POA: Diagnosis not present

## 2022-01-10 DIAGNOSIS — M2578 Osteophyte, vertebrae: Secondary | ICD-10-CM | POA: Diagnosis not present

## 2022-01-10 DIAGNOSIS — M4312 Spondylolisthesis, cervical region: Secondary | ICD-10-CM | POA: Diagnosis not present

## 2022-01-24 DIAGNOSIS — H6122 Impacted cerumen, left ear: Secondary | ICD-10-CM | POA: Diagnosis not present

## 2022-01-29 ENCOUNTER — Telehealth: Payer: Self-pay

## 2022-01-29 NOTE — Telephone Encounter (Signed)
MRN: [id number] Patient: Seth Ibarra, Seth Ibarra DOB: [birth year] Insurance: [employer] [facility] Member ID:  [id number] CPT: [phone]  (CT CERVICAL OR THORACIC FACET PARS INJ) DOS: 02/18/2022 Case #: [id number]   A provider peer to peer discussion is needed prior to approval of procedure: CT CERVICAL OR THORACIC FACET PARS INJ ([id number]), scheduled on 02/18/2022.   Can you please assist by calling [employer] at [phone] option 1 with case number: [id number]   Reason for provider peer to peer: A treatment plan that shows the procedure will be done at no more than three levels and will be between the C2-S1 levels of the spine.The request must be for a covered service. Facet joint injections and medial branch blocks using a steroid to treat pain are not supported by research and are not covered.   Peer to peer can be performed by a nurse practitioner, physician assistant or the doctor itself that knows the patient's case. Peer to peer review timeframe is 14 calendar days from 01/28/2022.

## 2022-02-05 NOTE — Telephone Encounter (Signed)
Danielle sent a message to Dr Gerrit Heck and the [facility] pre-service center

## 2022-02-13 ENCOUNTER — Telehealth: Payer: Self-pay | Admitting: Neurosurgery

## 2022-02-13 DIAGNOSIS — M542 Cervicalgia: Secondary | ICD-10-CM

## 2022-02-13 DIAGNOSIS — R519 Headache, unspecified: Secondary | ICD-10-CM

## 2022-02-13 NOTE — Telephone Encounter (Signed)
I spoke with Dr. Algernon Huxley at length regarding Seth Ibarra's symptoms and we reviewed his MRI results together over the phone.  Unfortunately Seth Ibarra has failed multiple conservative treatment options including chiropractic adjustments, dry needling, acupuncture, formal physical therapy and medication management as well as multiple cervical transforaminal ESI's (left C3-4 on 06/17/20 and left C5-6 on 04/03/20).  Dr. Algernon Huxley recommended attempting an upper cervical interlaminar epidural.  Should he fail this it may be reasonable to reconsider a facet injection as he does have degenerative facet disease on the left at C2-3 per MRI cervical spine 12/27/19.  I have placed a referral for upper cervical interlaminar epidural.   Manning Charity PA-C

## 2022-02-18 DIAGNOSIS — M4722 Other spondylosis with radiculopathy, cervical region: Secondary | ICD-10-CM | POA: Diagnosis not present

## 2022-02-18 DIAGNOSIS — M5031 Other cervical disc degeneration,  high cervical region: Secondary | ICD-10-CM | POA: Diagnosis not present

## 2022-02-18 DIAGNOSIS — M2578 Osteophyte, vertebrae: Secondary | ICD-10-CM | POA: Diagnosis not present

## 2022-02-18 DIAGNOSIS — M4802 Spinal stenosis, cervical region: Secondary | ICD-10-CM | POA: Diagnosis not present

## 2022-04-25 DIAGNOSIS — M5481 Occipital neuralgia: Secondary | ICD-10-CM | POA: Diagnosis not present

## 2022-04-25 DIAGNOSIS — H02402 Unspecified ptosis of left eyelid: Secondary | ICD-10-CM | POA: Diagnosis not present

## 2022-04-25 DIAGNOSIS — M792 Neuralgia and neuritis, unspecified: Secondary | ICD-10-CM | POA: Diagnosis not present

## 2022-04-25 DIAGNOSIS — M5030 Other cervical disc degeneration, unspecified cervical region: Secondary | ICD-10-CM | POA: Diagnosis not present

## 2022-04-30 ENCOUNTER — Telehealth: Payer: Self-pay

## 2022-04-30 DIAGNOSIS — M5030 Other cervical disc degeneration, unspecified cervical region: Secondary | ICD-10-CM

## 2022-04-30 DIAGNOSIS — M542 Cervicalgia: Secondary | ICD-10-CM

## 2022-04-30 NOTE — Telephone Encounter (Signed)
-----   Message from Rockey Situ sent at 04/30/2022 10:39 AM EST ----- Regarding: fu with Dr.Y Contact: [phone] Patient seen Danielle on 01/09/22 and was referred for an injection at [facility] radiology. He said that Duwayne Heck had ordered a specific injection but his insurance denied it, but approved another type of injection and that's the one he got. He felt very little relief. He is calling back to see if he he can get the initial injection Danielle wanted or should he see Dr. Myer Haff? Dr.Mark Hyacinth Meeker told him to try and see Dr.Yarbrough directly.

## 2022-04-30 NOTE — Telephone Encounter (Signed)
Initiated [place] through [facility]. Pending clinical review. Case # [id number]

## 2022-04-30 NOTE — Telephone Encounter (Signed)
Dr Myer Haff said he can have a referral for the other injection or come in for an appointment

## 2022-04-30 NOTE — Telephone Encounter (Addendum)
I spoke with Seth Ibarra. He would like to try the facet injection if his insurance will approve it this time. He is aware that I will submit a request to [facility] and we will wait for a response and if approved, we will send a referral to Dr Algernon Huxley.

## 2022-04-30 NOTE — Telephone Encounter (Signed)
This is a patient of Danielle's that we sent to [facility] for an injection. We tried to send him for a facet injection, but [employer] denied it and required him to have an ESI first. Dr Hyacinth Meeker suggested the patient see you.   Dr Algernon Huxley performed a CT guided left C3-4 interlaminar ESI on 02/18/22. His note on 02/18/22 says "If the current injection is not helpful in alleviating the patient's symptoms, recommend consideration of left-sided superior cervical facet joint injections, particularly at C2/3 and C3/4."   Appointment with you? Or referral back to Dr Algernon Huxley at [facility] for cervical facet injection?

## 2022-05-05 NOTE — Telephone Encounter (Signed)
[employer] requested information regarding injectate to be used and planned for RFA. I spoke with Damian Leavell at Dr Amrhein's office. She said there is no plan for RFA at this time. In regards to injectate, she suggested listing lidocaine, isoview contrast, and celestone. I uploaded an additional document with this information to [employer].

## 2022-05-07 NOTE — Telephone Encounter (Signed)
Request has been denied per automated call from [employer]. Details for a peer to peer will be sent at a later time

## 2022-05-07 NOTE — Telephone Encounter (Signed)
Peer to peer scheduled for 05/13/22 at 3:45pm with Dr Myer Haff and Dr Shelda Pal. I put the office # first, Dr Lucienne Capers cell # as 2nd option.

## 2022-05-13 NOTE — Telephone Encounter (Addendum)
Approved after peer to peer - they wanted to know there would be a plan for an RFA and they cannot use celestone for facet injections.  Auth# [id number] Valid 05/13/2022 - 11/09/2022 For [id number] and [id number] - left C2/3 and C3/4 facet injections  Referral placed.

## 2022-05-14 NOTE — Telephone Encounter (Signed)
Referral has been faxed to [facility].

## 2022-05-20 DIAGNOSIS — R7989 Other specified abnormal findings of blood chemistry: Secondary | ICD-10-CM | POA: Diagnosis not present

## 2022-05-20 DIAGNOSIS — Z0000 Encounter for general adult medical examination without abnormal findings: Secondary | ICD-10-CM | POA: Diagnosis not present

## 2022-05-20 DIAGNOSIS — Z125 Encounter for screening for malignant neoplasm of prostate: Secondary | ICD-10-CM | POA: Diagnosis not present

## 2022-05-27 DIAGNOSIS — M792 Neuralgia and neuritis, unspecified: Secondary | ICD-10-CM | POA: Diagnosis not present

## 2022-05-27 DIAGNOSIS — R7989 Other specified abnormal findings of blood chemistry: Secondary | ICD-10-CM | POA: Diagnosis not present

## 2022-05-27 DIAGNOSIS — Z0000 Encounter for general adult medical examination without abnormal findings: Secondary | ICD-10-CM | POA: Diagnosis not present

## 2022-08-13 DIAGNOSIS — M47812 Spondylosis without myelopathy or radiculopathy, cervical region: Secondary | ICD-10-CM | POA: Diagnosis not present

## 2022-08-13 DIAGNOSIS — M5031 Other cervical disc degeneration,  high cervical region: Secondary | ICD-10-CM | POA: Diagnosis not present

## 2022-08-13 DIAGNOSIS — M542 Cervicalgia: Secondary | ICD-10-CM | POA: Diagnosis not present

## 2022-08-18 DIAGNOSIS — K30 Functional dyspepsia: Secondary | ICD-10-CM | POA: Diagnosis not present

## 2022-08-18 DIAGNOSIS — Z1211 Encounter for screening for malignant neoplasm of colon: Secondary | ICD-10-CM | POA: Diagnosis not present

## 2022-08-18 DIAGNOSIS — K589 Irritable bowel syndrome without diarrhea: Secondary | ICD-10-CM | POA: Diagnosis not present

## 2022-08-18 DIAGNOSIS — K2980 Duodenitis without bleeding: Secondary | ICD-10-CM | POA: Diagnosis not present

## 2022-09-26 ENCOUNTER — Ambulatory Visit: Payer: 59

## 2022-09-26 DIAGNOSIS — Z1211 Encounter for screening for malignant neoplasm of colon: Secondary | ICD-10-CM

## 2022-11-25 DIAGNOSIS — G629 Polyneuropathy, unspecified: Secondary | ICD-10-CM | POA: Insufficient documentation

## 2023-11-24 ENCOUNTER — Ambulatory Visit: Payer: Self-pay

## 2023-11-24 DIAGNOSIS — Z23 Encounter for immunization: Secondary | ICD-10-CM

## 2023-11-24 NOTE — Progress Notes (Signed)
 Called [facility] requesting to get a Tdap.  States last injection was 04/10/2014.  Consent form signed.  Pre-filled Tdap syringe given in left deltoid.  Coner stayed in clinic for 20+ minutes after receiving vaccine.  No S/Sx of reaction.  Reviewed S/Sx of infection & advised to contact clinic if he notices any adverse reaction.  Verbalized understanding.

## 2024-01-25 IMAGING — MR MR HEAD W/O CM
11 series · 48 of 48 positions shown · non-contrast
Comparison: Cervical spine MRI 12/27/2019.

CLINICAL DATA: Provided history: Ptosis, left eyelid. Neuropathic
pain of chest. Additional history provided by scanning technologist:
Patient reports pain in neck and left side of head behind ear. Chest
and abdominal discomfort.

EXAM:
MRI HEAD WITHOUT CONTRAST
MRA HEAD WITHOUT CONTRAST
TECHNIQUE: Multiplanar, multi-echo pulse sequences of the brain and surrounding
structures were acquired without intravenous contrast. Angiographic
images of the Circle of Willis were acquired using MRA technique
without intravenous contrast.

[Series 6: T1 · sagittal · 4.0mm · 0.75mm/px · 2 of 31 slices shown (1 of 2)]
[im 1/31]
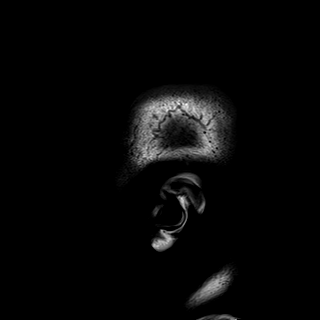
[im 31/31]
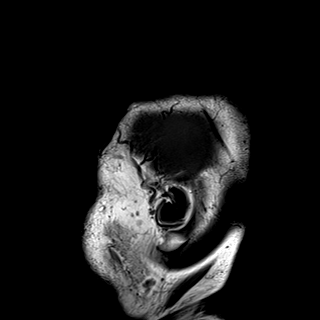

[Series 7: DWI · axial · 3.0mm · 0.94mm/px · z∈[-65,+104]mm · 11 of 192 slices shown (1 of 3)]
[im 1/192]
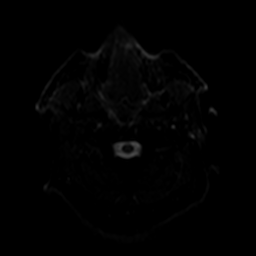
[im 20/192]
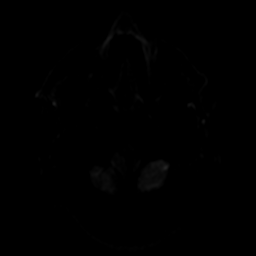
[im 39/192]
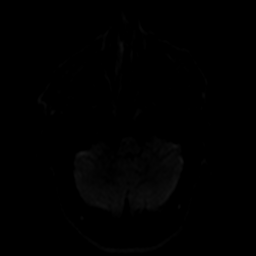
[im 58/192]
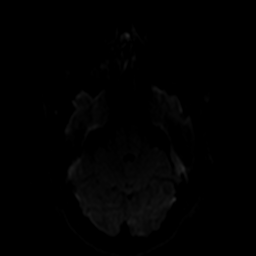
[im 77/192]
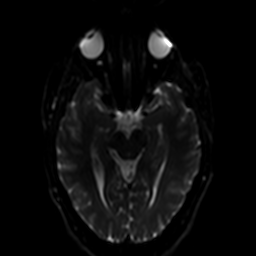
[im 96/192]
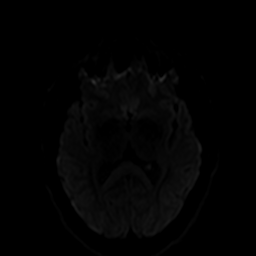
[im 115/192]
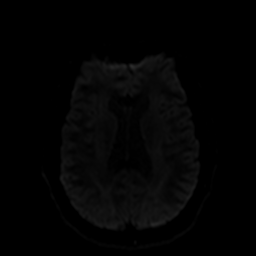
[im 134/192]
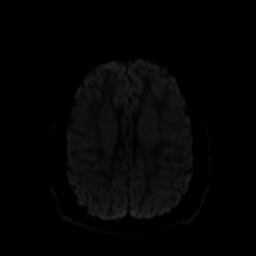
[im 153/192]
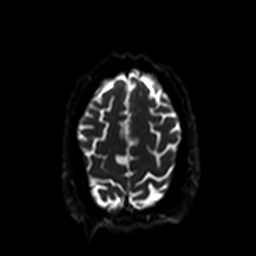
[im 172/192]
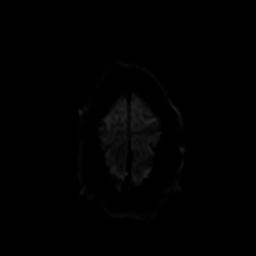
[im 192/192]
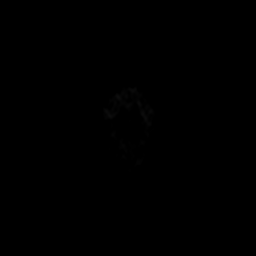

[Series 8: ax dwi_tracew · axial · 3.0mm · 0.94mm/px · z∈[-65,+104]mm · 6 of 96 slices shown]
[im 1/96]
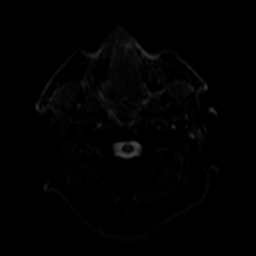
[im 20/96]
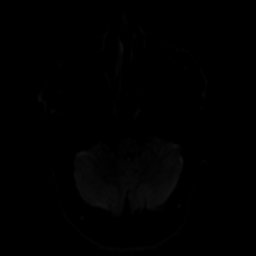
[im 39/96]
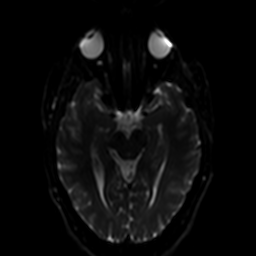
[im 58/96]
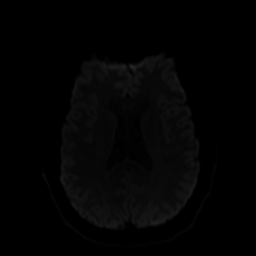
[im 77/96]
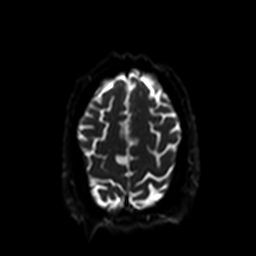
[im 96/96]
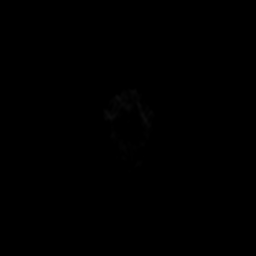

[Series 9: ax dwi_adc · axial · 3.0mm · 0.94mm/px · z∈[-65,+104]mm · 3 of 47 slices shown]
[im 1/47]
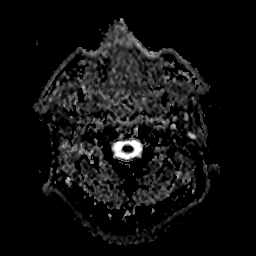
[im 24/47]
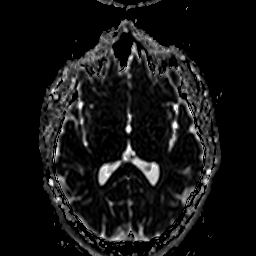
[im 47/47]
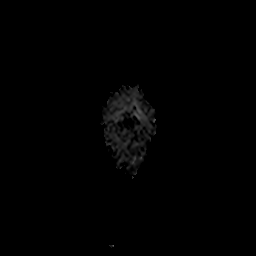

[Series 10: DWI · coronal · 5.0mm · 1.44mm/px · 4 of 74 slices shown (2 of 3)]
[im 1/74]
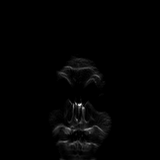
[im 25/74]
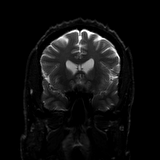
[im 49/74]
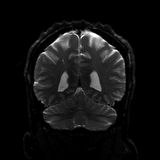
[im 74/74]
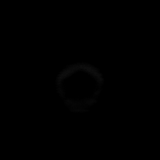

[Series 11: DWI · coronal · 5.0mm · 1.44mm/px · 2 of 37 slices shown (3 of 3)]
[im 1/37]
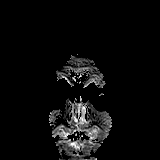
[im 37/37]
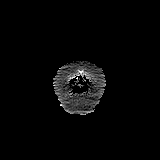

[Series 12: T2 · axial · 4.0mm · 0.36mm/px · z∈[-59,+112]mm · 2 of 34 slices shown (1 of 2)]
[im 1/34]
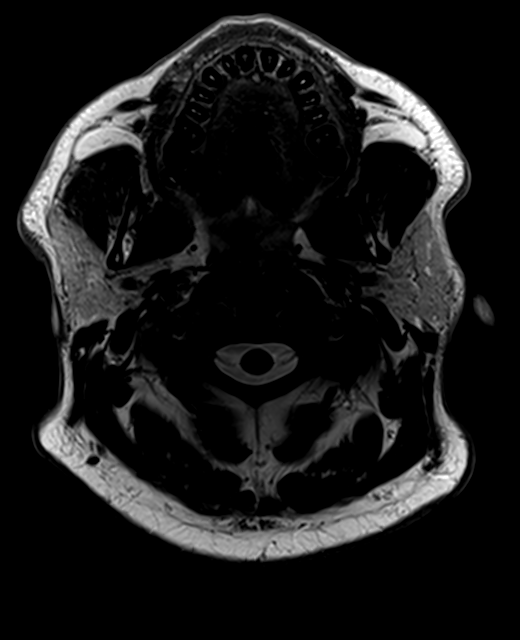
[im 34/34]
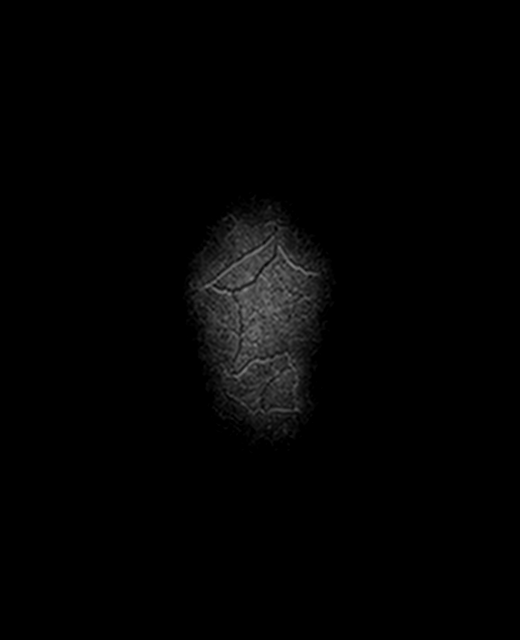

[Series 13: FLAIR · axial · 3.0mm · 0.72mm/px · z∈[-58,+110]mm · 2 of 29 slices shown]
[im 1/29]
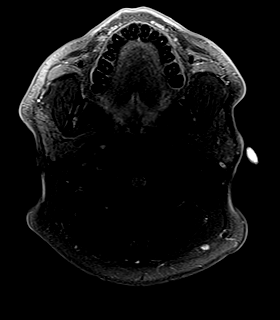
[im 29/29]
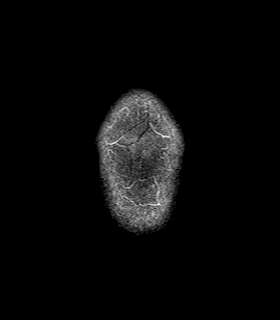

[Series 15: swi_images · axial · 3.0mm · 0.90mm/px · z∈[-62,+115]mm · 4 of 60 slices shown]
[im 1/60]
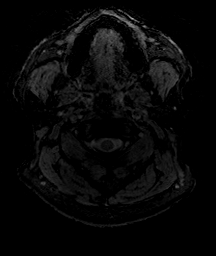
[im 20/60]
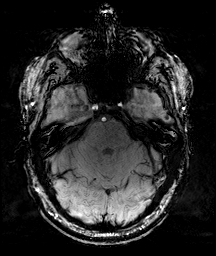
[im 40/60]
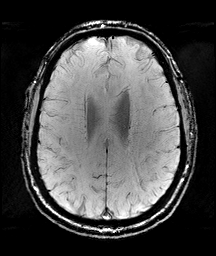
[im 60/60]
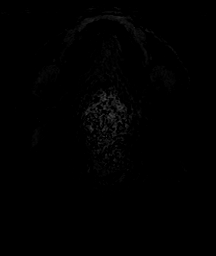

[Series 16: T1 · axial · 1.0mm · 0.90mm/px · z∈[-66,+109]mm · 10 of 176 slices shown (2 of 2)]
[im 1/176]
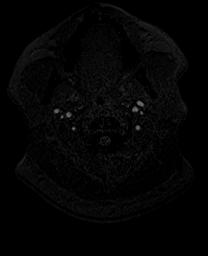
[im 20/176]
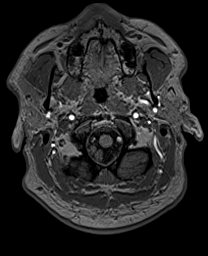
[im 39/176]
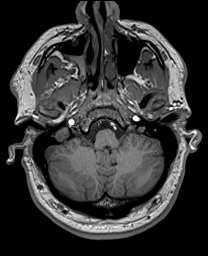
[im 59/176]
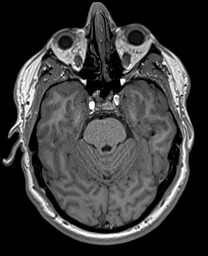
[im 78/176]
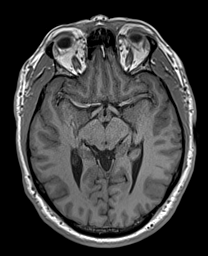
[im 98/176]
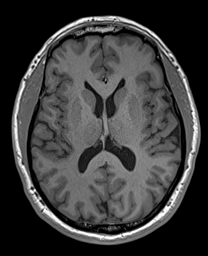
[im 117/176]
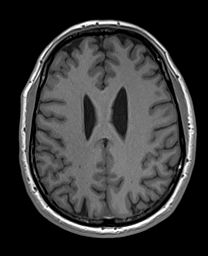
[im 137/176]
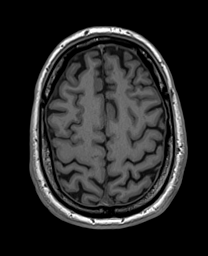
[im 156/176]
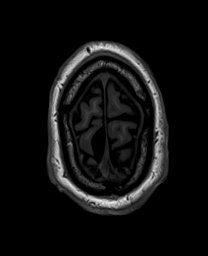
[im 176/176]
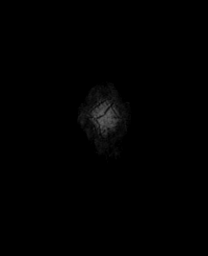

[Series 17: T2 · coronal · 4.5mm · 0.36mm/px · 2 of 35 slices shown (2 of 2)]
[im 1/35]
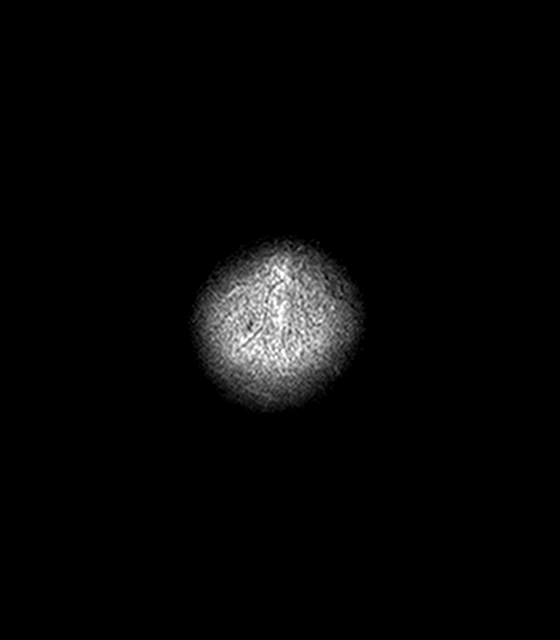
[im 35/35]
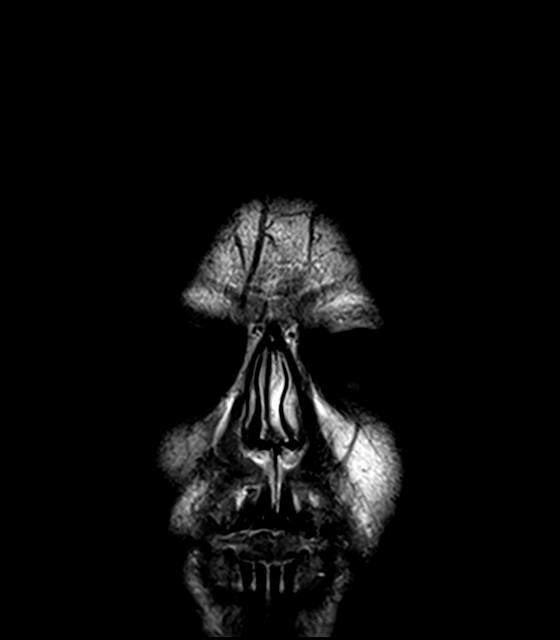

[48 of 48 positions shown; findings below may reference images not displayed]

FINDINGS: MRI HEAD FINDINGS

Brain:

No age-advanced or lobar predominant parenchymal atrophy.

Nonspecific 2 mm focus of T2 FLAIR hyperintense signal abnormality
within the left parietal lobe subcortical white matter (series 13,
image 21).

No cortical encephalomalacia is identified.

There is no acute infarct.

No evidence of an intracranial mass.

No chronic intracranial blood products.

No extra-axial fluid collection.

No midline shift.

Vascular: Maintained flow voids within the proximal large arterial
vessels. Right cerebellar developmental venous anomaly (anatomic
variant).

Skull and upper cervical spine: No focal suspicious marrow lesion.

Sinuses/Orbits: No acute finding within the imaged orbits.
Moderate-sized fluid level, small mucous retention cyst and
background mild mucosal thickening within the right maxillary sinus.
Minimal mucosal thickening scattered within left ethmoid air cells.

MRA HEAD FINDING:

Anterior circulation:

The intracranial internal carotid arteries are patent. The M1 middle
cerebral arteries are patent. No M2 proximal branch occlusion or
high-grade proximal stenosis is identified. The anterior cerebral
arteries are patent. No intracranial aneurysm is identified. 2 mm
infundibulum arising from the cavernous left ICA (series 8, image
83) (series 103, image 5).

Posterior circulation:

The intracranial vertebral arteries are patent. The basilar artery
is patent. The posterior cerebral arteries are patent. Posterior
communicating arteries are present bilaterally.

Anatomic variants: None significant.
IMPRESSION: MRI brain:

1. No evidence of acute intracranial abnormality.
2. 2 mm T2 FLAIR hyperintense remote insult within the left parietal
lobe white matter, nonspecific and of doubtful clinical significance
as an isolated white matter focus.
3. Right maxillary sinusitis.

MRA head:

1. No intracranial large vessel occlusion or proximal high-grade
arterial stenosis.
2. No intracranial aneurysm is identified.
